# Patient Record
Sex: Male | Born: 1995 | Race: White | Hispanic: No | Marital: Single | Smoking: Current every day smoker
Health system: Southern US, Community
[De-identification: ages and names within clinical notes are randomized; demographics above are authoritative.]

## PROBLEM LIST (undated history)

## (undated) VITALS — BP 112/74 | HR 104 | Temp 98.2°F | Resp 20 | Ht 68.75 in | Wt 122.0 lb

## (undated) DIAGNOSIS — F4310 Post-traumatic stress disorder, unspecified: Secondary | ICD-10-CM

## (undated) DIAGNOSIS — F1994 Other psychoactive substance use, unspecified with psychoactive substance-induced mood disorder: Secondary | ICD-10-CM

## (undated) DIAGNOSIS — F319 Bipolar disorder, unspecified: Secondary | ICD-10-CM

## (undated) DIAGNOSIS — F1910 Other psychoactive substance abuse, uncomplicated: Secondary | ICD-10-CM

## (undated) DIAGNOSIS — Z9189 Other specified personal risk factors, not elsewhere classified: Secondary | ICD-10-CM

## (undated) HISTORY — PX: OTHER SURGICAL HISTORY: SHX169

---
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
## 2014-04-27 ENCOUNTER — Encounter (HOSPITAL_COMMUNITY): Payer: Self-pay | Admitting: Emergency Medicine

## 2014-04-27 ENCOUNTER — Emergency Department (HOSPITAL_COMMUNITY)
Admission: EM | Admit: 2014-04-27 | Discharge: 2014-04-27 | Disposition: A | Discharge status: Home / Self Care | Payer: BC Managed Care – PPO | Attending: Emergency Medicine | Admitting: Emergency Medicine

## 2014-04-27 DIAGNOSIS — Y9389 Activity, other specified: Secondary | ICD-10-CM | POA: Insufficient documentation

## 2014-04-27 DIAGNOSIS — Y929 Unspecified place or not applicable: Secondary | ICD-10-CM | POA: Insufficient documentation

## 2014-04-27 DIAGNOSIS — F17200 Nicotine dependence, unspecified, uncomplicated: Secondary | ICD-10-CM | POA: Insufficient documentation

## 2014-04-27 DIAGNOSIS — T40601A Poisoning by unspecified narcotics, accidental (unintentional), initial encounter: Secondary | ICD-10-CM

## 2014-04-27 DIAGNOSIS — T4271XA Poisoning by unspecified antiepileptic and sedative-hypnotic drugs, accidental (unintentional), initial encounter: Secondary | ICD-10-CM | POA: Insufficient documentation

## 2014-04-27 DIAGNOSIS — Z79899 Other long term (current) drug therapy: Secondary | ICD-10-CM | POA: Insufficient documentation

## 2014-04-27 LAB — CBC WITH DIFFERENTIAL/PLATELET
BASOS PCT: 0 % (ref 0–1)
Basophils Absolute: 0.1 10*3/uL (ref 0.0–0.1)
EOS ABS: 0.1 10*3/uL (ref 0.0–0.7)
Eosinophils Relative: 1 % (ref 0–5)
HCT: 44.9 % (ref 39.0–52.0)
HEMOGLOBIN: 16 g/dL (ref 13.0–17.0)
LYMPHS ABS: 1.4 10*3/uL (ref 0.7–4.0)
Lymphocytes Relative: 9 % — ABNORMAL LOW (ref 12–46)
MCH: 33.5 pg (ref 26.0–34.0)
MCHC: 35.6 g/dL (ref 30.0–36.0)
MCV: 94.1 fL (ref 78.0–100.0)
MONOS PCT: 5 % (ref 3–12)
Monocytes Absolute: 0.8 10*3/uL (ref 0.1–1.0)
NEUTROS ABS: 13.1 10*3/uL — AB (ref 1.7–7.7)
NEUTROS PCT: 85 % — AB (ref 43–77)
PLATELETS: 142 10*3/uL — AB (ref 150–400)
RBC: 4.77 MIL/uL (ref 4.22–5.81)
RDW: 11.6 % (ref 11.5–15.5)
WBC: 15.4 10*3/uL — ABNORMAL HIGH (ref 4.0–10.5)

## 2014-04-27 LAB — ETHANOL

## 2014-04-27 LAB — BASIC METABOLIC PANEL
Anion gap: 17 — ABNORMAL HIGH (ref 5–15)
BUN: 14 mg/dL (ref 6–23)
CHLORIDE: 102 meq/L (ref 96–112)
CO2: 19 mEq/L (ref 19–32)
Calcium: 9.1 mg/dL (ref 8.4–10.5)
Creatinine, Ser: 1.59 mg/dL — ABNORMAL HIGH (ref 0.50–1.35)
GFR, EST AFRICAN AMERICAN: 71 mL/min — AB (ref >90)
GFR, EST NON AFRICAN AMERICAN: 61 mL/min — AB (ref >90)
GLUCOSE: 192 mg/dL — AB (ref 70–99)
POTASSIUM: 4 meq/L (ref 3.7–5.3)
SODIUM: 138 meq/L (ref 137–147)

## 2014-04-27 MED ORDER — ONDANSETRON HCL 4 MG/2ML IJ SOLN
4.0000 mg | Freq: Once | INTRAMUSCULAR | Status: AC
  Administered 2014-04-27: 4 mg via INTRAVENOUS
  Filled 2014-04-27: qty 2

## 2014-04-27 MED ORDER — SODIUM CHLORIDE 0.9 % IV SOLN
INTRAVENOUS | Status: DC
  Administered 2014-04-27: 13:00:00 via INTRAVENOUS

## 2014-04-27 NOTE — ED Provider Notes (Signed)
CSN: [id number]     Arrival date & time 04/27/14  1242 History   First MD Initiated Contact with Patient 04/27/14 1253     Chief Complaint  Patient presents with  . Drug Overdose     (Consider location/radiation/quality/duration/timing/severity/associated sxs/prior Treatment) HPI  Sabino Niemanndam J Rhoads is a 18 y.o. male who is here by EMS, after treatment with Narcan for suspected narcotics overdose. The patient's friends placed him in a i-STAT, and injected his vein, with salt water, after he became unresponsive, following taking heroin. The patient is a reluctant historian. He does not want to tell me what happened today. He, states that he just wants to see his friend.  Level V caveat- poor historian  History reviewed. No pertinent past medical history. History reviewed. No pertinent past surgical history. History reviewed. No pertinent family history. History  Substance Use Topics  . Smoking status: Current Every Day Smoker -- 1.00 packs/day    Types: Cigarettes  . Smokeless tobacco: Never Used  . Alcohol Use: Yes     Comment: occasionally     Review of Systems  Unable to perform ROS     Allergies  Review of patient's allergies indicates no known allergies.  Home Medications   Prior to Admission medications   Medication Sig Start Date End Date Taking? Authorizing Provider  divalproex (DEPAKOTE) 500 MG DR tablet Take 500-1,000 mg by mouth 2 (two) times daily. 500mg  in the morning and 1000mg  at night    Historical Provider, MD  lamoTRIgine (LAMICTAL) 25 MG tablet Take 75 mg by mouth daily.    Historical Provider, MD   BP 128/92  Pulse 99  Temp(Src) 96.3 F (35.7 C) (Rectal)  Resp 23  SpO2 100% Physical Exam  Nursing note and vitals reviewed. Constitutional: He is oriented to person, place, and time. He appears well-developed and well-nourished.  HENT:  Head: Normocephalic and atraumatic.  Right Ear: External ear normal.  Left Ear: External ear normal.  Eyes:  Conjunctivae and EOM are normal. Pupils are equal, round, and reactive to light.  Neck: Normal range of motion and phonation normal. Neck supple.  Cardiovascular: Normal rate, regular rhythm, normal heart sounds and intact distal pulses.   Pulmonary/Chest: Effort normal and breath sounds normal. He exhibits no bony tenderness.  Abdominal: Soft. He exhibits no mass. There is no tenderness. There is no guarding.  Musculoskeletal: Normal range of motion.  Neurological: He is alert and oriented to person, place, and time. No cranial nerve deficit or sensory deficit. He exhibits normal muscle tone. Coordination normal.  Skin: Skin is warm, dry and intact.  Psychiatric: His behavior is normal. Judgment and thought content normal.  Tearful, appears sad    ED Course  Procedures (including critical care time)  Medications  0.9 %  sodium chloride infusion ( Intravenous New Bag/Given 04/27/14 1303)    Patient Vitals for the past 24 hrs:  BP Temp Temp src Pulse Resp SpO2  04/27/14 1500 128/92 mmHg - - - 23 -  04/27/14 1445 - - - 99 17 100 %  04/27/14 1430 127/98 mmHg - - 69 19 97 %  04/27/14 1403 125/97 mmHg - - 93 14 99 %  04/27/14 1251 144/88 mmHg 96.3 F (35.7 C) Rectal 69 16 99 %  04/27/14 1246 - - - 72 16 100 %  04/27/14 1244 144/88 mmHg - - - - -    3:37 PM Reevaluation with update and discussion. After initial assessment and treatment, an updated evaluation reveals  is sleeping, but arouses easily. He is alert and communicative. He denies pain, headache, trouble breathing, chest pain, nausea, or vomiting. He declines any additional assistance. Findings discussed with patient, questions answered. Jocelyne Reinertsen L   Labs Review Labs Reviewed  CBC WITH DIFFERENTIAL - Abnormal; Notable for the following:    WBC 15.4 (*)    Platelets 142 (*)    Neutrophils Relative % 85 (*)    Neutro Abs 13.1 (*)    Lymphocytes Relative 9 (*)    All other components within normal limits  BASIC METABOLIC  PANEL - Abnormal; Notable for the following:    Glucose, Bld 192 (*)    Creatinine, Ser 1.59 (*)    GFR calc non Af Amer 61 (*)    GFR calc Af Amer 71 (*)    Anion gap 17 (*)    All other components within normal limits  ETHANOL  URINALYSIS, ROUTINE W REFLEX MICROSCOPIC  URINE RAPID DRUG SCREEN (HOSP PERFORMED)    Imaging Review No results found.   EKG Interpretation   Date/Time:  Tuesday April 27 2014 12:45:33 EDT Ventricular Rate:  69 PR Interval:  154 QRS Duration: 81 QT Interval:  430 QTC Calculation: 461 R Axis:   76 Text Interpretation:  Sinus rhythm Atrial premature complex RSR' in V1 or  V2, probably normal variant Nonspecific T abnrm, anterolateral leads No  old tracing to compare Confirmed by [facility]  MD, Faustina Gebert [id number]) on  04/27/2014 1:53:17 PM      MDM   Final diagnoses:  Narcotic overdose, accidental or unintentional, initial encounter    Probable heroin overdose, without significant associated toxic or metabolic abnormality.  Nursing Notes Reviewed/ Care Coordinated Applicable Imaging Reviewed Interpretation of Laboratory Data incorporated into ED treatment  The patient appears reasonably screened and/or stabilized for discharge and I doubt any other medical condition or other [facility] requiring further screening, evaluation, or treatment in the ED at this time prior to discharge.  Plan: Home Medications- none; Home Treatments- Stop using Heroin; return here if the recommended treatment, does not improve the symptoms; Recommended follow up- PCP of choice prn    Flint Melter, MD 04/27/14 1542

## 2014-04-27 NOTE — ED Notes (Signed)
Sister contacted, on her way to pick patient up. MD Effie ShyWentz ok for patient to leave without urine collected. Patient was unable to give a sample. In and out cath unsuccessful.

## 2014-04-27 NOTE — ED Notes (Signed)
Per family friend- "he told me he took a bunch of Xanax. His dad shot himself recently and he (patient) found him. The funeral is today." Attempting to reach sister, unsuccessful. Continually re-orienting patient and reassuring him we are trying to contact his sister.

## 2014-04-27 NOTE — ED Notes (Signed)
Per EMS- friend's found patient unconscious and tried to put him in the shower. Unsure what patient overdosed on. Needle on scene. Unsure what this was used for. Friends and patient poor historians. Friends drew up unknown amount of table salt and inserted this is patient's veins. 1mg  Narcan given intranasally. Pulse bounding. RR under control after Narcan. 1mg  Narcan given IV. 18 G PIV in right FA.  A&Ox4. VS: BP 140/100 HR 100 RR 20.

## 2014-04-27 NOTE — ED Notes (Signed)
Attempted calling Spencer AddisonKatie (sister) at [phone]. No answer. Will attempt to call back promptly.  Pt aware we need urine sample.

## 2014-04-27 NOTE — ED Notes (Signed)
Bed: RESB Expected date:  Expected time:  Means of arrival:  Comments: EMS-OD 

## 2014-04-27 NOTE — ED Notes (Signed)
Pt aware we need urine sample. Will re-evaluate shortly.

## 2014-04-27 NOTE — Discharge Instructions (Signed)
Avoid using heroin or other intoxication substances.  Follow up with a medical doctor as needed for ongoing problems.   Accidental Overdose A drug overdose occurs when a chemical substance (drug or medication) is used in amounts large enough to overcome a person. This may result in severe illness or death. This is a type of poisoning. Accidental overdoses of medications or other substances come from a variety of reasons. When this happens accidentally, it is often because the person taking the substance does not know enough about what they have taken. Drugs which commonly cause overdose deaths are alcohol, psychotropic medications (medications which affect the mind), pain medications, illegal drugs (street drugs) such as cocaine and heroin, and multiple drugs taken at the same time. It may result from careless behavior (such as over-indulging at a party). Other causes of overdose may include multiple drug use, a lapse in memory, or drug use after a period of no drug use.  Sometimes overdosing occurs because a person cannot remember if they have taken their medication.  A common unintentional overdose in young children involves multi-vitamins containing iron. Iron is a part of the hemoglobin molecule in blood. It is used to transport oxygen to living cells. When taken in small amounts, iron allows the body to restock hemoglobin. In large amounts, it causes problems in the body. If this overdose is not treated, it can lead to death. Never take medicines that show signs of tampering or do not seem quite right. Never take medicines in the dark or in poor lighting. Read the label and check each dose of medicine before you take it. When adults are poisoned, it happens most often through carelessness or lack of information. Taking medicines in the dark or taking medicine prescribed for someone else to treat the same type of problem is a dangerous practice. SYMPTOMS  Symptoms of overdose depend on the medication  and amount taken. They can vary from over-activity with stimulant over-dosage, to sleepiness from depressants such as alcohol, narcotics and tranquilizers. Confusion, dizziness, nausea and vomiting may be present. If problems are severe enough coma and death may result. DIAGNOSIS  Diagnosis and management are generally straightforward if the drug is known. Otherwise it is more difficult. At times, certain symptoms and signs exhibited by the patient, or blood tests, can reveal the drug in question.  TREATMENT  In an emergency department, most patients can be treated with supportive measures. Antidotes may be available if there has been an overdose of opioids or benzodiazepines. A rapid improvement will often occur if this is the cause of overdose. At home or away from medical care:  There may be no immediate problems or warning signs in children.  Not everything works well in all cases of poisoning.  Take immediate action. Poisons may act quickly.  If you think someone has swallowed medicine or a household product, and the person is unconscious, having seizures (convulsions), or is not breathing, immediately call for an ambulance. IF a person is conscious and appears to be doing OK but has swallowed a poison:  Do not wait to see what effect the poison will have. Immediately call a poison control center (listed in the white pages of your telephone book under "Poison Control" or inside the front cover with other emergency numbers). Some poison control centers have TTY capability for the deaf. Check with your local center if you or someone in your family requires this service.  Keep the container so you can read the label on the  product for ingredients.  Describe what, when, and how much was taken and the age and condition of the person poisoned. Inform them if the person is vomiting, choking, drowsy, shows a change in color or temperature of skin, is conscious or unconscious, or is convulsing.  Do  not cause vomiting unless instructed by medical personnel. Do not induce vomiting or force liquids into a person who is convulsing, unconscious, or very drowsy. Stay calm and in control.   Activated charcoal also is sometimes used in certain types of poisoning and you may wish to add a supply to your emergency medicines. It is available without a prescription. Call a poison control center before using this medication. PREVENTION  Thousands of children die every year from unintentional poisoning. This may be from household chemicals, poisoning from carbon monoxide in a car, taking their parent's medications, or simply taking a few iron pills or vitamins with iron. Poisoning comes from unexpected sources.  Store medicines out of the sight and reach of children, preferably in a locked cabinet. Do not keep medications in a food cabinet. Always store your medicines in a secure place. Get rid of expired medications.  If you have children living with you or have them as occasional guests, you should have child-resistant caps on your medicine containers. Keep everything out of reach. Child proof your home.  If you are called to the telephone or to answer the door while you are taking a medicine, take the container with you or put the medicine out of the reach of small children.  Do not take your medication in front of children. Do not tell your child how good a medication is and how good it is for them. They may get the idea it is more of a treat.  If you are an adult and have accidentally taken an overdose, you need to consider how this happened and what can be done to prevent it from happening again. If this was from a street drug or alcohol, determine if there is a problem that needs addressing. If you are not sure a problems exists, it is easy to talk to a professional and ask them if they think you have a problem. It is better to handle this problem in this way before it happens again and has a much  worse consequence. Document Released: 12/01/2004 Document Revised: 12/10/2011 Document Reviewed: 05/09/2009 [facility] Patient Information 2015 [place]. This information is not intended to replace advice given to you by your health care provider. Make sure you discuss any questions you have with your health care provider.

## 2014-04-27 NOTE — ED Notes (Signed)
Fluids given, tolerated well per MD.

## 2014-05-18 ENCOUNTER — Emergency Department (HOSPITAL_BASED_OUTPATIENT_CLINIC_OR_DEPARTMENT_OTHER): Payer: BC Managed Care – PPO

## 2014-05-18 ENCOUNTER — Encounter (HOSPITAL_BASED_OUTPATIENT_CLINIC_OR_DEPARTMENT_OTHER): Payer: Self-pay | Admitting: Emergency Medicine

## 2014-05-18 ENCOUNTER — Inpatient Hospital Stay (HOSPITAL_BASED_OUTPATIENT_CLINIC_OR_DEPARTMENT_OTHER)
Admission: EM | Admit: 2014-05-18 | Discharge: 2014-05-18 | DRG: 918 | Discharge status: Against Medical Advice | Payer: BC Managed Care – PPO | Attending: Internal Medicine | Admitting: Internal Medicine

## 2014-05-18 DIAGNOSIS — T424X4A Poisoning by benzodiazepines, undetermined, initial encounter: Secondary | ICD-10-CM | POA: Diagnosis present

## 2014-05-18 DIAGNOSIS — F319 Bipolar disorder, unspecified: Secondary | ICD-10-CM | POA: Diagnosis present

## 2014-05-18 DIAGNOSIS — T40601S Poisoning by unspecified narcotics, accidental (unintentional), sequela: Secondary | ICD-10-CM

## 2014-05-18 DIAGNOSIS — T43591A Poisoning by other antipsychotics and neuroleptics, accidental (unintentional), initial encounter: Secondary | ICD-10-CM | POA: Diagnosis present

## 2014-05-18 DIAGNOSIS — T400X1A Poisoning by opium, accidental (unintentional), initial encounter: Principal | ICD-10-CM | POA: Diagnosis present

## 2014-05-18 DIAGNOSIS — F17200 Nicotine dependence, unspecified, uncomplicated: Secondary | ICD-10-CM | POA: Diagnosis present

## 2014-05-18 DIAGNOSIS — T6591XS Toxic effect of unspecified substance, accidental (unintentional), sequela: Secondary | ICD-10-CM

## 2014-05-18 DIAGNOSIS — T40601A Poisoning by unspecified narcotics, accidental (unintentional), initial encounter: Secondary | ICD-10-CM | POA: Diagnosis present

## 2014-05-18 DIAGNOSIS — F1310 Sedative, hypnotic or anxiolytic abuse, uncomplicated: Secondary | ICD-10-CM | POA: Diagnosis present

## 2014-05-18 DIAGNOSIS — F1210 Cannabis abuse, uncomplicated: Secondary | ICD-10-CM | POA: Diagnosis present

## 2014-05-18 DIAGNOSIS — F1910 Other psychoactive substance abuse, uncomplicated: Secondary | ICD-10-CM | POA: Diagnosis present

## 2014-05-18 DIAGNOSIS — Z79899 Other long term (current) drug therapy: Secondary | ICD-10-CM | POA: Diagnosis not present

## 2014-05-18 DIAGNOSIS — F1410 Cocaine abuse, uncomplicated: Secondary | ICD-10-CM | POA: Diagnosis present

## 2014-05-18 DIAGNOSIS — T50901S Poisoning by unspecified drugs, medicaments and biological substances, accidental (unintentional), sequela: Secondary | ICD-10-CM

## 2014-05-18 HISTORY — DX: Bipolar disorder, unspecified: F31.9

## 2014-05-18 LAB — CBC WITH DIFFERENTIAL/PLATELET
BASOS PCT: 1 % (ref 0–1)
Basophils Absolute: 0 10*3/uL (ref 0.0–0.1)
EOS PCT: 2 % (ref 0–5)
Eosinophils Absolute: 0.2 10*3/uL (ref 0.0–0.7)
HEMATOCRIT: 41.6 % (ref 39.0–52.0)
HEMOGLOBIN: 14.3 g/dL (ref 13.0–17.0)
Lymphocytes Relative: 43 % (ref 12–46)
Lymphs Abs: 2.8 10*3/uL (ref 0.7–4.0)
MCH: 32.7 pg (ref 26.0–34.0)
MCHC: 34.4 g/dL (ref 30.0–36.0)
MCV: 95.2 fL (ref 78.0–100.0)
MONO ABS: 0.6 10*3/uL (ref 0.1–1.0)
MONOS PCT: 9 % (ref 3–12)
NEUTROS ABS: 2.9 10*3/uL (ref 1.7–7.7)
Neutrophils Relative %: 45 % (ref 43–77)
Platelets: 136 10*3/uL — ABNORMAL LOW (ref 150–400)
RBC: 4.37 MIL/uL (ref 4.22–5.81)
RDW: 10.8 % — ABNORMAL LOW (ref 11.5–15.5)
WBC: 6.5 10*3/uL (ref 4.0–10.5)

## 2014-05-18 LAB — COMPREHENSIVE METABOLIC PANEL
ALBUMIN: 4.3 g/dL (ref 3.5–5.2)
ALK PHOS: 54 U/L (ref 39–117)
ALT: 13 U/L (ref 0–53)
AST: 20 U/L (ref 0–37)
Anion gap: 13 (ref 5–15)
BILIRUBIN TOTAL: 0.4 mg/dL (ref 0.3–1.2)
BUN: 20 mg/dL (ref 6–23)
CHLORIDE: 104 meq/L (ref 96–112)
CO2: 26 meq/L (ref 19–32)
CREATININE: 1.6 mg/dL — AB (ref 0.50–1.35)
Calcium: 9.5 mg/dL (ref 8.4–10.5)
GFR calc Af Amer: 70 mL/min — ABNORMAL LOW (ref >90)
GFR calc non Af Amer: 61 mL/min — ABNORMAL LOW (ref >90)
Glucose, Bld: 121 mg/dL — ABNORMAL HIGH (ref 70–99)
POTASSIUM: 3.4 meq/L — AB (ref 3.7–5.3)
SODIUM: 143 meq/L (ref 137–147)
Total Protein: 6.7 g/dL (ref 6.0–8.3)

## 2014-05-18 LAB — RAPID URINE DRUG SCREEN, HOSP PERFORMED
Amphetamines: NOT DETECTED
BARBITURATES: NOT DETECTED
Benzodiazepines: POSITIVE — AB
Cocaine: POSITIVE — AB
Opiates: POSITIVE — AB
TETRAHYDROCANNABINOL: POSITIVE — AB

## 2014-05-18 LAB — CBG MONITORING, ED
GLUCOSE-CAPILLARY: 107 mg/dL — AB (ref 70–99)
Glucose-Capillary: 98 mg/dL (ref 70–99)

## 2014-05-18 LAB — LIPASE, BLOOD: LIPASE: 152 U/L — AB (ref 11–59)

## 2014-05-18 LAB — URINALYSIS, ROUTINE W REFLEX MICROSCOPIC
BILIRUBIN URINE: NEGATIVE
Glucose, UA: NEGATIVE mg/dL
Hgb urine dipstick: NEGATIVE
KETONES UR: NEGATIVE mg/dL
LEUKOCYTES UA: NEGATIVE
NITRITE: NEGATIVE
Protein, ur: 30 mg/dL — AB
Specific Gravity, Urine: 1.021 (ref 1.005–1.030)
UROBILINOGEN UA: 1 mg/dL (ref 0.0–1.0)
pH: 6 (ref 5.0–8.0)

## 2014-05-18 LAB — ETHANOL: Alcohol, Ethyl (B): <11 mg/dL (ref 0–11)

## 2014-05-18 LAB — URINE MICROSCOPIC-ADD ON

## 2014-05-18 LAB — ACETAMINOPHEN LEVEL: Acetaminophen (Tylenol), Serum: <15 ug/mL (ref 10–30)

## 2014-05-18 LAB — MRSA PCR SCREENING: MRSA by PCR: NEGATIVE

## 2014-05-18 MED ORDER — SODIUM CHLORIDE 0.9 % IV BOLUS (SEPSIS)
250.0000 mL | Freq: Once | INTRAVENOUS | Status: AC
  Administered 2014-05-18: 250 mL via INTRAVENOUS

## 2014-05-18 MED ORDER — ONDANSETRON HCL 4 MG/2ML IJ SOLN
4.0000 mg | Freq: Once | INTRAMUSCULAR | Status: AC
  Administered 2014-05-18: 4 mg via INTRAVENOUS
  Filled 2014-05-18: qty 2

## 2014-05-18 MED ORDER — NALOXONE HCL 0.4 MG/ML IJ SOLN
INTRAMUSCULAR | Status: AC
  Filled 2014-05-18: qty 1

## 2014-05-18 MED ORDER — NALOXONE HCL 0.4 MG/ML IJ SOLN
INTRAMUSCULAR | Status: DC
Start: 2014-05-18 — End: 2014-05-18
  Filled 2014-05-18: qty 1

## 2014-05-18 MED ORDER — NALOXONE HCL 1 MG/ML IJ SOLN
2.0000 mg | Freq: Once | INTRAMUSCULAR | Status: AC
  Administered 2014-05-18: 2 mg via INTRAVENOUS
  Filled 2014-05-18: qty 2

## 2014-05-18 MED ORDER — NALOXONE HCL 1 MG/ML IJ SOLN
INTRAMUSCULAR | Status: AC
  Administered 2014-05-18: 2 mg
  Filled 2014-05-18: qty 4

## 2014-05-18 MED ORDER — SODIUM CHLORIDE 0.9 % IV SOLN: INTRAVENOUS | Status: DC

## 2014-05-18 MED ORDER — NALOXONE HCL 0.4 MG/ML IJ SOLN
0.4000 mg | Freq: Once | INTRAMUSCULAR | Status: AC
  Administered 2014-05-18: 0.4 mg via INTRAVENOUS

## 2014-05-18 MED ORDER — SODIUM CHLORIDE 0.9 % IV SOLN
INTRAVENOUS | Status: DC
  Administered 2014-05-18 (×2): via INTRAVENOUS

## 2014-05-18 NOTE — ED Notes (Signed)
PCXR performed

## 2014-05-18 NOTE — ED Notes (Signed)
Report called to Anna, RN.

## 2014-05-18 NOTE — ED Notes (Signed)
Opens eyes to voice, however does not follow commands. Reoriented to situation and place.

## 2014-05-18 NOTE — ED Provider Notes (Signed)
CSN: [id number]     Arrival date & time 05/18/14  1136 History   First MD Initiated Contact with Patient 05/18/14 1148     Chief Complaint  Patient presents with  . Drug Overdose     (Consider location/radiation/quality/duration/timing/severity/associated sxs/prior Treatment) The history is provided by a friend. The history is limited by the condition of the patient.   level V caveat applies to the history due to the patient's unresponsiveness.  18 year old male dropped off by friends to be unresponsive with shallow breathing. Friend stated that patient had taken 2 Ambien and 2 Percocet one hour prior to arrival. Friend left.  Past Medical History  Diagnosis Date  . Substance abuse   . Bipolar 1 disorder    Past Surgical History  Procedure Laterality Date  . Unknown     No family history on file. History  Substance Use Topics  . Smoking status: Current Every Day Smoker -- 1.00 packs/day    Types: Cigarettes  . Smokeless tobacco: Never Used  . Alcohol Use: Yes     Comment: occasionally     Review of Systems  Unable to perform ROS  level V caveat applies the review of systems patient unresponsive upon arrival    Allergies  Review of patient's allergies indicates no known allergies.  Home Medications   Prior to Admission medications   Medication Sig Start Date End Date Taking? Authorizing Provider  divalproex (DEPAKOTE) 500 MG DR tablet Take 500-1,000 mg by mouth 2 (two) times daily. 500mg  in the morning and 1000mg  at night    Historical Provider, MD  lamoTRIgine (LAMICTAL) 25 MG tablet Take 75 mg by mouth daily.    Historical Provider, MD   BP 114/78  Pulse 112  Resp 17  Wt 135 lb (61.236 kg)  SpO2 100% Physical Exam  Nursing note and vitals reviewed. Constitutional: He appears well-developed and well-nourished. He appears distressed.  Unresponsive shallow breathing  Eyes:  Pupils pinpoint  Neck: Neck supple.  Cardiovascular: Regular rhythm and normal heart  sounds.   No murmur heard. Tachycardic  Pulmonary/Chest: He has no wheezes. He has no rales.  Shallow breathing  Abdominal: Soft. Bowel sounds are normal. He exhibits no distension.  Musculoskeletal: Normal range of motion. He exhibits no edema.  Neurological:  Unresponsive  Skin: No rash noted. There is erythema.    ED Course  Procedures (including critical care time) Labs Review Labs Reviewed  URINALYSIS, ROUTINE W REFLEX MICROSCOPIC - Abnormal; Notable for the following:    Protein, ur 30 (*)    All other components within normal limits  URINE RAPID DRUG SCREEN (HOSP PERFORMED) - Abnormal; Notable for the following:    Opiates POSITIVE (*)    Cocaine POSITIVE (*)    Benzodiazepines POSITIVE (*)    Tetrahydrocannabinol POSITIVE (*)    All other components within normal limits  COMPREHENSIVE METABOLIC PANEL - Abnormal; Notable for the following:    Potassium 3.4 (*)    Glucose, Bld 121 (*)    Creatinine, Ser 1.60 (*)    GFR calc non Af Amer 61 (*)    GFR calc Af Amer 70 (*)    All other components within normal limits  LIPASE, BLOOD - Abnormal; Notable for the following:    Lipase 152 (*)    All other components within normal limits  CBC WITH DIFFERENTIAL - Abnormal; Notable for the following:    RDW 10.8 (*)    Platelets 136 (*)    All other components  within normal limits  CBG MONITORING, ED - Abnormal; Notable for the following:    Glucose-Capillary 107 (*)    All other components within normal limits  ETHANOL  ACETAMINOPHEN LEVEL  URINE MICROSCOPIC-ADD ON    Imaging Review Dg Chest Port 1 View  05/25/14   CLINICAL DATA:  Overdose of with Percocet and sacs now with altered mental status  EXAM: PORTABLE CHEST - 1 VIEW  COMPARISON:  None.  FINDINGS: The lungs are mildly hyperinflated and clear. The heart and mediastinal structures are normal. The pulmonary vascularity is not engorged. The bony thorax is unremarkable.  IMPRESSION: There is no acute cardiopulmonary  abnormality. Mild hyperinflation may be voluntary or reflect the patient's smoking history.   Electronically Signed   By: David  [place]   On: 05-25-2014 12:18     EKG Interpretation   Date/Time:  Tuesday May 25, 2014 12:14:03 EDT Ventricular Rate:  96 PR Interval:  148 QRS Duration: 96 QT Interval:  362 QTC Calculation: 457 R Axis:   73 Text Interpretation:  Normal sinus rhythm Possible Left atrial enlargement  Incomplete right bundle branch block Borderline ECG No significant change  since last tracing Confirmed by Quincy Prisco  MD, Jaeshaun Riva ([id number]) on 05/25/14  12:19:28 PM     CRITICAL CARE Performed by: Vanetta Mulders Total critical care time: 30 Critical care time was exclusive of separately billable procedures and treating other patients. Critical care was necessary to treat or prevent imminent or life-threatening deterioration. Critical care was time spent personally by me on the following activities: development of treatment plan with patient and/or surrogate as well as nursing, discussions with consultants, evaluation of patient's response to treatment, examination of patient, obtaining history from patient or surrogate, ordering and performing treatments and interventions, ordering and review of laboratory studies, ordering and review of radiographic studies, pulse oximetry and re-evaluation of patient's condition.  MDM   Final diagnoses:  Opiate overdose, accidental or unintentional, sequela    The patient dropped off by friends unresponsive shallow breathing pinpoint pupils. Friend stated he took 2 Ambien and 2 Percocet one hour prior to arrival. Review of patient's records shows admitted [place] [facility] on July 28 for similar presentation. Patient recovered in the ED and was discharged home. Patient has a past medical history significant for substance abuse and bipolar disorder. Patient required Narcan received 2 mg at dinner rales was 100%  nonrebreather she sats 1 from the 70s up to 100%. 2 hours after the initial dose of Narcan the patient became drowsy and less responsive, and was given an additional 0.4 mg of Narcan without any significant improvement. Another 2 mg of Narcan given. Patient will require step down admission transfer arranged to [facility] the hospitalist service. Patient's urine drug screen positive for opiates benzos and THC. Patient when he was awake with not admit to taking anything says he doesn't remember anything. Patient was confronted about the admission in July and at what was found in the urine drug screen and told that he had arrived in very serious life-threatening condition almost died.    Vanetta Mulders, MD May 25, 2014 [phone]

## 2014-05-18 NOTE — H&P (Signed)
[facility] History and Physical  Spencer Rogers OYD:[id number] DOB: 11-15-1995 DOA: 05/18/2014   PCP: No primary provider Spencer Rogers file.    Chief Complaint: unresponsive   HPI: Spencer Rogers Spencer Rogers is a 18 y.o. male dropped off by his friends unresponsive at the ER today. UDS revealed narcotics, benzos, marijuana and cocaine. He was given 3 doses to Narcan and then [facility] [place] [facility]. He is awake and alert now and tells me he only recalls using Xanax. He is feeling well. No nausea, vomiting or diarrhea. He has not been using drugs on a daily basis. He last used Heroin abut 2 wks ago. He does not want to stay in the hospital and will be leaving AMA.    General: The patient denies anorexia, fever, weight loss Cardiac: Denies chest pain, syncope, palpitations, pedal edema  Respiratory: Denies cough, shortness of breath, wheezing GI: Denies severe indigestion/heartburn, abdominal pain, nausea, vomiting, diarrhea and constipation GU: Denies hematuria, incontinence, dysuria  Musculoskeletal: Denies arthritis  Skin: Denies suspicious skin lesions Neurologic: Denies focal weakness or numbness, change in vision  Past Medical History  Diagnosis Date  . Substance abuse   . Bipolar 1 disorder     Past Surgical History  Procedure Laterality Date  . Unknown      Social History: smokes about 1/2 ppd. Does not drink Alcohol Living with sister for past few days. Unable to afford living on his own. Works 2 jobs.    No Known Allergies  No family history on file.    Prior to Admission medications   Medication Sig Start Date End Date Taking? Authorizing Provider  divalproex (DEPAKOTE) 500 MG DR tablet Take 500-1,000 mg by mouth 2 (two) times daily. 500mg  in the morning and 1000mg  at night    Historical Provider, MD  lamoTRIgine (LAMICTAL) 25 MG tablet Take 75 mg by mouth daily.    Historical Provider, MD     Physical Exam: Filed Vitals:   05/18/14 1345 05/18/14  1440 05/18/14 1449 05/18/14 1617  BP: 114/78 107/69 114/74   Pulse: 112 117 111   Temp:   97.7 F (36.5 C) 98 F (36.7 C)  TempSrc:   Oral Oral  Resp: 17 12 16    Weight:      SpO2: 100% 100% 100%      General: sleepy HEENT: Normocephalic and Atraumatic, Mucous membranes pink                PERRLA; EOM intact; No scleral icterus,                 Nares: Patent, Oropharynx: Clear, Fair Dentition                 Neck: FROM, no cervical lymphadenopathy, thyromegaly, carotid bruit or JVD;  Breasts: deferred CHEST WALL: No tenderness  CHEST: Normal respiration, clear to auscultation bilaterally  HEART: Regular rate and rhythm; no murmurs rubs or gallops  BACK: No kyphosis or scoliosis; no CVA tenderness  ABDOMEN: Positive Bowel Sounds, soft, non-tender; no masses, no organomegaly Rectal Exam: deferred EXTREMITIES: No cyanosis, clubbing, or edema Genitalia: not examined  SKIN:  no rash or ulceration  CNS: Alert and Oriented x 4, Nonfocal exam, CN 2-12 intact  Labs on Admission:  Basic Metabolic Panel:  Recent Labs Lab 05/18/14 1140  NA 143  K 3.4*  CL 104  CO2 26  GLUCOSE 121*  BUN 20  CREATININE 1.60*  CALCIUM 9.5   Liver Function Tests:  Recent Labs Lab 05/18/14 1140  AST 20  ALT 13  ALKPHOS 54  BILITOT 0.4  PROT 6.7  ALBUMIN 4.3    Recent Labs Lab 05/18/14 1140  LIPASE 152*   No results found for this basename: AMMONIA,  in the last 168 hours CBC:  Recent Labs Lab 05/18/14 1140  WBC 6.5  NEUTROABS 2.9  HGB 14.3  HCT 41.6  MCV 95.2  PLT 136*   Cardiac Enzymes: No results found for this basename: CKTOTAL, CKMB, CKMBINDEX, TROPONINI,  in the last 168 hours  BNP (last 3 results) No results found for this basename: PROBNP,  in the last 8760 hours CBG:  Recent Labs Lab 05/18/14 1144 05/18/14 1441  GLUCAP 107* 98    Radiological Exams on Admission: Dg Chest Port 1 View  05/18/2014   CLINICAL DATA:  Overdose of with Percocet and sacs now  with altered mental status  EXAM: PORTABLE CHEST - 1 VIEW  COMPARISON:  None.  FINDINGS: The lungs are mildly hyperinflated and clear. The heart and mediastinal structures are normal. The pulmonary vascularity is not engorged. The bony thorax is unremarkable.  IMPRESSION: There is no acute cardiopulmonary abnormality. Mild hyperinflation may be voluntary or reflect the patient's smoking history.   Electronically Signed   By: David  [place]   On: 05/18/2014 12:18    EKG: Independently reviewed. NSR 96 bpm with incomplete right bundle branch block  Assessment/Plan Active Problems:   Opiate, Benzo, Cannabis and Cocaine abuse.  He is leaving AMA despite my advice to stay over night. His sister is present and will be driving him to her home where she will monitor him overnight.   Time spent: 45 min  Margurite Duffy, MD [facility]  If 7PM-7AM, please contact night-coverage [email or web address] 05/18/2014, 5:11 PM

## 2014-05-18 NOTE — Discharge Summary (Signed)
Physician Discharge Summary  Spencer Rogers ZOX:[id number] DOB: [birth year] DOA: 05/18/2014  PCP: No primary provider on file.  Admit date: 05/18/2014 Discharge date: 05/18/2014   Leaving AMA  Discharge Diagnoses:  Active Problems:   Opiate, benzodiazepine, Cocaine and Cannabis overdose   Discharge Condition: stable  Diet recommendation: heart healthy  Filed Weights   05/18/14 1140  Weight: 61.236 kg (135 lb)    History of present illness:  Spencer Rogers is a 18 y.o. male dropped off by his friends unresponsive at the ER today. UDS revealed narcotics, benzos, marijuana and cocaine. He was given 3 doses to Narcan and then [facility] [place] [facility]. He is awake and alert now and tells me he only recalls using Xanax. He is feeling well. No nausea, vomiting or diarrhea. He has not been using drugs on a daily basis. He last used Heroin abut 2 wks ago. He does not want to stay in the hospital and will be leaving AMA.   Discharge Exam: Filed Vitals:   05/18/14 1345 05/18/14 1440 05/18/14 1449 05/18/14 1617  BP: 114/78 107/69 114/74   Pulse: 112 117 111   Temp:   97.7 F (36.5 C) 98 F (36.7 C)  TempSrc:   Oral Oral  Resp: 17 12 16    Weight:      SpO2: 100% 100% 100%     General: AAA x 3 Cardiovascular: RRR, no murmurs Respiratory: CTA b/L  Discharge Instructions You were cared for by a hospitalist during your hospital stay. If you have any questions about your discharge medications or the care you received while you were in the hospital after you are discharged, you can call the unit and asked to speak with the hospitalist on call if the hospitalist that took care of you is not available. Once you are discharged, your primary care physician will handle any further medical issues. Please note that NO REFILLS for any discharge medications will be authorized once you are discharged, as it is imperative that you return to your primary care physician (or establish  a relationship with a primary care physician if you do not have one) for your aftercare needs so that they can reassess your need for medications and monitor your lab values.     Medication List    ASK your doctor about these medications       divalproex 500 MG DR tablet  Commonly known as:  DEPAKOTE  Take 500-1,000 mg by mouth 2 (two) times daily. 500mg  in the morning and 1000mg  at night     lamoTRIgine 25 MG tablet  Commonly known as:  LAMICTAL  Take 75 mg by mouth daily.       No Known Allergies    The results of significant diagnostics from this hospitalization (including imaging, microbiology, ancillary and laboratory) are listed below for reference.    Significant Diagnostic Studies: Dg Chest Port 1 View  05/18/2014   CLINICAL DATA:  Overdose of with Percocet and sacs now with altered mental status  EXAM: PORTABLE CHEST - 1 VIEW  COMPARISON:  None.  FINDINGS: The lungs are mildly hyperinflated and clear. The heart and mediastinal structures are normal. The pulmonary vascularity is not engorged. The bony thorax is unremarkable.  IMPRESSION: There is no acute cardiopulmonary abnormality. Mild hyperinflation may be voluntary or reflect the patient's smoking history.   Electronically Signed   By: David  [place]   On: 05/18/2014 12:18    Microbiology: No results found for  this or any previous visit (from the past 240 hour(s)).   Labs: Basic Metabolic Panel:  Recent Labs Lab 05/18/14 1140  NA 143  K 3.4*  CL 104  CO2 26  GLUCOSE 121*  BUN 20  CREATININE 1.60*  CALCIUM 9.5   Liver Function Tests:  Recent Labs Lab 05/18/14 1140  AST 20  ALT 13  ALKPHOS 54  BILITOT 0.4  PROT 6.7  ALBUMIN 4.3    Recent Labs Lab 05/18/14 1140  LIPASE 152*   No results found for this basename: AMMONIA,  in the last 168 hours CBC:  Recent Labs Lab 05/18/14 1140  WBC 6.5  NEUTROABS 2.9  HGB 14.3  HCT 41.6  MCV 95.2  PLT 136*   Cardiac Enzymes: No results found for  this basename: CKTOTAL, CKMB, CKMBINDEX, TROPONINI,  in the last 168 hours BNP: BNP (last 3 results) No results found for this basename: PROBNP,  in the last 8760 hours CBG:  Recent Labs Lab 05/18/14 1144 05/18/14 1441  GLUCAP 107* 98       SignedCalvert Cantor:  Naoma Boxell, MD [facility] 05/18/2014, 5:40 PM

## 2014-05-18 NOTE — Progress Notes (Signed)
Pt does not want to stay in hospital and already signed AMA, family at bedside and explained the pt the benefit of staying at least overnight to be monitored but he still refused.

## 2014-05-18 NOTE — ED Notes (Signed)
Arrived via POV with friends.  Unresponsive. Friends stated pt took "2 Ambien 10mg  and 2 Percocet" 1 hour PTA.
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
## 2014-05-18 NOTE — ED Notes (Signed)
MD at bedside. 

## 2014-05-18 NOTE — ED Notes (Signed)
Neta Mendshristina Scallion, emergency contact informed of plan of care and given room number.  Contact reports pt was verbalizing suicidal ideation last night, however he (patient) denies SI.  Tells me he was recently in AA and attempting to get placed in [facility] for rehab. Transport team at bedside and transferred to stretcher.  Pt transported to [facility] stable up d/c from this ED.

## 2014-05-18 NOTE — Plan of Care (Signed)
Called by care Link for patient Spencer Rogers, [facility] presenting with overdose  Briefly 18 year old male with history of polysubstance abuse, bipolar disorder, [facility] his friends and dropped off there unresponsive, pinpoint pupils. Urine drug screen showed benzodiazepines, opiates, cocaine and marijuana. Patient was given Narcan x2, patient became alert and mentioned taking 'pain medications'although unclear at this time what exactly he took. Similar presentation in July 2015 to [facility] ED with narcotic overdose, at that time he had injected himself with heroin.  BP 114/78  Pulse 112  Resp 17  Wt 61.236 kg (135 lb)  SpO2 100%  Patient accepted to step down unit, team 10.    RAI,RIPUDEEP M.D. [facility] 05/18/2014, 1:58 PM  Pager: [phone]

## 2014-05-18 NOTE — ED Notes (Signed)
Attempting to call pt sister, no answer, pt himself left a voicemail.

## 2014-05-18 NOTE — Progress Notes (Signed)
Pt got here in rm 2c14 [facility], confused, he is claiming that he had over 200 dollars in his wallet, but we only got 35 dollars and some coins in his black wallet witnessed by Marcelle SmilingNatasha RN,he also have 2 pair of shorts and 1 black shoe and he also missing his other pair of big round black (gauge)earing,already called security,he wants to go home and he is crying and requested a chaplain, MD was already paged, sitter at bedside.
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
## 2014-05-18 NOTE — ED Notes (Signed)
Talking and more alert.

## 2014-05-18 NOTE — ED Notes (Signed)
More drowsy.  VSS.  Opens eyes with sternal rub the back to sleep.  EDP informed.
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
## 2014-05-18 NOTE — ED Notes (Signed)
I &O cath inserted per protocol by Ninetta LightsAmy Burns, RN.  Pt tolerated well, clear yellow urine return noted and urine collected.

## 2014-05-18 NOTE — ED Notes (Signed)
MD at bedside speaking with pt.  Pt demanding we find his truck as well as his belongings and yelling at EDP.  Message left on sister's voicemail and attempting to contact emergency contact per pt request.

## 2014-05-22 ENCOUNTER — Inpatient Hospital Stay (HOSPITAL_COMMUNITY)
Admission: AD | Admit: 2014-05-22 | Discharge: 2014-05-25 | DRG: 897 | Disposition: A | Discharge status: Home / Self Care | Payer: BC Managed Care – PPO | Attending: Psychiatry | Admitting: Psychiatry

## 2014-05-22 ENCOUNTER — Encounter (HOSPITAL_COMMUNITY): Payer: Self-pay

## 2014-05-22 ENCOUNTER — Encounter (HOSPITAL_COMMUNITY): Payer: Self-pay | Admitting: Emergency Medicine

## 2014-05-22 ENCOUNTER — Emergency Department (HOSPITAL_COMMUNITY)
Admission: EM | Admit: 2014-05-22 | Discharge: 2014-05-22 | Disposition: A | Discharge status: Other Acute Inpatient Hospital | Payer: BC Managed Care – PPO | Attending: Emergency Medicine | Admitting: Emergency Medicine

## 2014-05-22 DIAGNOSIS — Z634 Disappearance and death of family member: Secondary | ICD-10-CM

## 2014-05-22 DIAGNOSIS — F1410 Cocaine abuse, uncomplicated: Secondary | ICD-10-CM | POA: Diagnosis present

## 2014-05-22 DIAGNOSIS — Z79899 Other long term (current) drug therapy: Secondary | ICD-10-CM | POA: Insufficient documentation

## 2014-05-22 DIAGNOSIS — R45851 Suicidal ideations: Secondary | ICD-10-CM

## 2014-05-22 DIAGNOSIS — IMO0001 Reserved for inherently not codable concepts without codable children: Secondary | ICD-10-CM | POA: Diagnosis present

## 2014-05-22 DIAGNOSIS — T40601A Poisoning by unspecified narcotics, accidental (unintentional), initial encounter: Secondary | ICD-10-CM | POA: Diagnosis present

## 2014-05-22 DIAGNOSIS — F1120 Opioid dependence, uncomplicated: Secondary | ICD-10-CM | POA: Diagnosis present

## 2014-05-22 DIAGNOSIS — F17200 Nicotine dependence, unspecified, uncomplicated: Secondary | ICD-10-CM | POA: Diagnosis present

## 2014-05-22 DIAGNOSIS — F319 Bipolar disorder, unspecified: Secondary | ICD-10-CM | POA: Insufficient documentation

## 2014-05-22 DIAGNOSIS — F1994 Other psychoactive substance use, unspecified with psychoactive substance-induced mood disorder: Secondary | ICD-10-CM | POA: Diagnosis present

## 2014-05-22 DIAGNOSIS — F4310 Post-traumatic stress disorder, unspecified: Secondary | ICD-10-CM | POA: Diagnosis present

## 2014-05-22 DIAGNOSIS — Z008 Encounter for other general examination: Secondary | ICD-10-CM | POA: Diagnosis present

## 2014-05-22 DIAGNOSIS — F1310 Sedative, hypnotic or anxiolytic abuse, uncomplicated: Secondary | ICD-10-CM | POA: Diagnosis present

## 2014-05-22 DIAGNOSIS — F411 Generalized anxiety disorder: Secondary | ICD-10-CM | POA: Diagnosis present

## 2014-05-22 DIAGNOSIS — F1210 Cannabis abuse, uncomplicated: Secondary | ICD-10-CM | POA: Diagnosis present

## 2014-05-22 DIAGNOSIS — F909 Attention-deficit hyperactivity disorder, unspecified type: Secondary | ICD-10-CM | POA: Diagnosis present

## 2014-05-22 DIAGNOSIS — F1110 Opioid abuse, uncomplicated: Secondary | ICD-10-CM

## 2014-05-22 DIAGNOSIS — G4700 Insomnia, unspecified: Secondary | ICD-10-CM | POA: Diagnosis present

## 2014-05-22 LAB — COMPREHENSIVE METABOLIC PANEL
ALT: 15 U/L (ref 0–53)
AST: 26 U/L (ref 0–37)
Albumin: 4 g/dL (ref 3.5–5.2)
Alkaline Phosphatase: 53 U/L (ref 39–117)
Anion gap: 14 (ref 5–15)
BUN: 13 mg/dL (ref 6–23)
CO2: 22 mEq/L (ref 19–32)
Calcium: 9.6 mg/dL (ref 8.4–10.5)
Chloride: 102 mEq/L (ref 96–112)
Creatinine, Ser: 1.2 mg/dL (ref 0.50–1.35)
GFR calc Af Amer: >90 mL/min (ref >90)
GFR calc non Af Amer: 86 mL/min — ABNORMAL LOW (ref >90)
Glucose, Bld: 122 mg/dL — ABNORMAL HIGH (ref 70–99)
Potassium: 4 mEq/L (ref 3.7–5.3)
Sodium: 138 mEq/L (ref 137–147)
Total Bilirubin: 0.4 mg/dL (ref 0.3–1.2)
Total Protein: 6.7 g/dL (ref 6.0–8.3)

## 2014-05-22 LAB — ACETAMINOPHEN LEVEL: Acetaminophen (Tylenol), Serum: <15 ug/mL (ref 10–30)

## 2014-05-22 LAB — CBC
HCT: 40.4 % (ref 39.0–52.0)
Hemoglobin: 13.7 g/dL (ref 13.0–17.0)
MCH: 31.3 pg (ref 26.0–34.0)
MCHC: 33.9 g/dL (ref 30.0–36.0)
MCV: 92.2 fL (ref 78.0–100.0)
PLATELETS: 129 10*3/uL — AB (ref 150–400)
RBC: 4.38 MIL/uL (ref 4.22–5.81)
RDW: 11.1 % — ABNORMAL LOW (ref 11.5–15.5)
WBC: 3.7 10*3/uL — AB (ref 4.0–10.5)

## 2014-05-22 LAB — RAPID URINE DRUG SCREEN, HOSP PERFORMED
Amphetamines: POSITIVE — AB
Barbiturates: NOT DETECTED
Benzodiazepines: POSITIVE — AB
Cocaine: NOT DETECTED
Opiates: POSITIVE — AB
Tetrahydrocannabinol: POSITIVE — AB

## 2014-05-22 LAB — SALICYLATE LEVEL: Salicylate Lvl: <2 mg/dL — ABNORMAL LOW (ref 2.8–20.0)

## 2014-05-22 LAB — ETHANOL: Alcohol, Ethyl (B): <11 mg/dL (ref 0–11)

## 2014-05-22 MED ORDER — LOPERAMIDE HCL 2 MG PO CAPS: 2.0000 mg | ORAL_CAPSULE | ORAL | Status: DC | PRN

## 2014-05-22 MED ORDER — CLONIDINE HCL 0.1 MG PO TABS
0.1000 mg | ORAL_TABLET | Freq: Four times a day (QID) | ORAL | Status: AC
  Administered 2014-05-22 – 2014-05-25 (×7): 0.1 mg via ORAL
  Filled 2014-05-22 (×12): qty 1

## 2014-05-22 MED ORDER — NICOTINE POLACRILEX 2 MG MT GUM
2.0000 mg | CHEWING_GUM | OROMUCOSAL | Status: DC | PRN
  Filled 2014-05-22: qty 1

## 2014-05-22 MED ORDER — ONDANSETRON 4 MG PO TBDP: 4.0000 mg | ORAL_TABLET | Freq: Four times a day (QID) | ORAL | Status: DC | PRN

## 2014-05-22 MED ORDER — ONDANSETRON 4 MG PO TBDP
4.0000 mg | ORAL_TABLET | Freq: Four times a day (QID) | ORAL | Status: DC | PRN
  Administered 2014-05-22: 4 mg via ORAL
  Filled 2014-05-22: qty 1

## 2014-05-22 MED ORDER — CLONIDINE HCL 0.1 MG PO TABS
0.1000 mg | ORAL_TABLET | ORAL | Status: DC
Start: 2014-05-25 — End: 2014-05-25
  Filled 2014-05-22 (×4): qty 1

## 2014-05-22 MED ORDER — CHLORDIAZEPOXIDE HCL 25 MG PO CAPS: 25.0000 mg | ORAL_CAPSULE | ORAL | Status: DC

## 2014-05-22 MED ORDER — MAGNESIUM HYDROXIDE 400 MG/5ML PO SUSP
30.0000 mL | Freq: Every day | ORAL | Status: DC | PRN
  Administered 2014-05-24: 30 mL via ORAL

## 2014-05-22 MED ORDER — HYDROXYZINE HCL 25 MG PO TABS
25.0000 mg | ORAL_TABLET | Freq: Four times a day (QID) | ORAL | Status: DC | PRN
  Administered 2014-05-22 – 2014-05-23 (×3): 25 mg via ORAL
  Filled 2014-05-22 (×3): qty 1

## 2014-05-22 MED ORDER — DICYCLOMINE HCL 20 MG PO TABS: 20.0000 mg | ORAL_TABLET | Freq: Four times a day (QID) | ORAL | Status: DC | PRN

## 2014-05-22 MED ORDER — NAPROXEN 500 MG PO TABS: 500.0000 mg | ORAL_TABLET | Freq: Two times a day (BID) | ORAL | Status: DC | PRN

## 2014-05-22 MED ORDER — CHLORDIAZEPOXIDE HCL 25 MG PO CAPS: 25.0000 mg | ORAL_CAPSULE | Freq: Every day | ORAL | Status: DC

## 2014-05-22 MED ORDER — TRAZODONE HCL 50 MG PO TABS
50.0000 mg | ORAL_TABLET | Freq: Every evening | ORAL | Status: DC | PRN
  Administered 2014-05-23 – 2014-05-24 (×2): 50 mg via ORAL
  Filled 2014-05-22 (×2): qty 1
  Filled 2014-05-22: qty 4

## 2014-05-22 MED ORDER — CHLORDIAZEPOXIDE HCL 25 MG PO CAPS
25.0000 mg | ORAL_CAPSULE | Freq: Four times a day (QID) | ORAL | Status: DC
  Administered 2014-05-22: 25 mg via ORAL
  Filled 2014-05-22: qty 1

## 2014-05-22 MED ORDER — ALUM & MAG HYDROXIDE-SIMETH 200-200-20 MG/5ML PO SUSP: 30.0000 mL | ORAL | Status: DC | PRN

## 2014-05-22 MED ORDER — METHOCARBAMOL 500 MG PO TABS
500.0000 mg | ORAL_TABLET | Freq: Three times a day (TID) | ORAL | Status: DC | PRN
  Administered 2014-05-22 – 2014-05-23 (×3): 500 mg via ORAL
  Filled 2014-05-22 (×3): qty 1

## 2014-05-22 MED ORDER — CLONIDINE HCL 0.1 MG PO TABS: 0.1000 mg | ORAL_TABLET | Freq: Every day | ORAL | Status: DC

## 2014-05-22 MED ORDER — HYDROXYZINE HCL 25 MG PO TABS: 25.0000 mg | ORAL_TABLET | Freq: Four times a day (QID) | ORAL | Status: DC | PRN

## 2014-05-22 MED ORDER — CLONIDINE HCL 0.1 MG PO TABS
0.1000 mg | ORAL_TABLET | Freq: Four times a day (QID) | ORAL | Status: DC
Start: 2014-05-22 — End: 2014-05-22
  Administered 2014-05-22: 0.1 mg via ORAL
  Filled 2014-05-22: qty 1

## 2014-05-22 MED ORDER — ADULT MULTIVITAMIN W/MINERALS CH
1.0000 | ORAL_TABLET | Freq: Every day | ORAL | Status: DC
  Administered 2014-05-22: 1 via ORAL
  Filled 2014-05-22: qty 1

## 2014-05-22 MED ORDER — THIAMINE HCL 100 MG/ML IJ SOLN: 100.0000 mg | Freq: Once | INTRAMUSCULAR | Status: DC

## 2014-05-22 MED ORDER — CLONIDINE HCL 0.1 MG PO TABS: 0.1000 mg | ORAL_TABLET | ORAL | Status: DC

## 2014-05-22 MED ORDER — METHOCARBAMOL 500 MG PO TABS: 500.0000 mg | ORAL_TABLET | Freq: Three times a day (TID) | ORAL | Status: DC | PRN

## 2014-05-22 MED ORDER — CHLORDIAZEPOXIDE HCL 25 MG PO CAPS: 25.0000 mg | ORAL_CAPSULE | Freq: Four times a day (QID) | ORAL | Status: DC | PRN

## 2014-05-22 MED ORDER — VITAMIN B-1 100 MG PO TABS: 100.0000 mg | ORAL_TABLET | Freq: Every day | ORAL | Status: DC

## 2014-05-22 MED ORDER — DICYCLOMINE HCL 20 MG PO TABS
20.0000 mg | ORAL_TABLET | Freq: Four times a day (QID) | ORAL | Status: DC | PRN
  Administered 2014-05-22: 20 mg via ORAL
  Filled 2014-05-22: qty 1

## 2014-05-22 MED ORDER — NAPROXEN 500 MG PO TABS
500.0000 mg | ORAL_TABLET | Freq: Two times a day (BID) | ORAL | Status: DC | PRN
  Administered 2014-05-22 – 2014-05-24 (×4): 500 mg via ORAL
  Filled 2014-05-22 (×4): qty 1

## 2014-05-22 MED ORDER — CHLORDIAZEPOXIDE HCL 25 MG PO CAPS: 25.0000 mg | ORAL_CAPSULE | Freq: Three times a day (TID) | ORAL | Status: DC

## 2014-05-22 MED ORDER — ACETAMINOPHEN 325 MG PO TABS
650.0000 mg | ORAL_TABLET | Freq: Four times a day (QID) | ORAL | Status: DC | PRN
Start: 2014-05-22 — End: 2014-05-25

## 2014-05-22 NOTE — Tx Team (Signed)
Initial Interdisciplinary Treatment Plan   PATIENT STRESSORS: Financial difficulties Medication change or noncompliance Occupational concerns Substance abuse   PROBLEM LIST: Problem List/Patient Goals Date to be addressed Date deferred Reason deferred Estimated date of resolution  SA 05/22/14     Non-compliance with medications 05/22/14     Financial issues 05/22/14     Housing issues 05/22/14                                    DISCHARGE CRITERIA:  Improved stabilization in mood, thinking, and/or behavior Motivation to continue treatment in Rogers less acute level of care Withdrawal symptoms are absent or subacute and managed without 24-hour nursing intervention  PRELIMINARY DISCHARGE PLAN: Attend aftercare/continuing care group Placement in alternative living arrangements  PATIENT/FAMIILY INVOLVEMENT: This treatment plan has been presented to and reviewed with the patient, Spencer Rogers.  The patient and family have been given the opportunity to ask questions and make suggestions.  Spencer Rogers, Spencer Rogers 05/22/2014, 8:28 PM

## 2014-05-22 NOTE — ED Notes (Signed)
Patient [facility] Bed [facility]1 to Dr. Rosezella Rumpfobbos. EDP notified by Clinical research associatewriter.   [name] MSW, LCSW [facility]rapeutic Triage Services-Triage Specialist   Phone: [phone] Fax: [phone]

## 2014-05-22 NOTE — BH Assessment (Signed)
Tele Assessment Note   Spencer Rogers is an 18 y.o. male that presented as a walk-in to [facility] via family friends.  Pt presenting with SI and requesting detox from heroin.  Pt reported he began using heroin after he walked in on his father that committed suicide by shooting himself in the head.  Pt is the one that found him in his home.  Pt stated he has been sharing $70 between 3 people of IV heroin daily, last use was 30 cc (used 7 x that day) per pt.  Pt reported he has experimented with alcohol and drugs in the past, but not with heroin.  Pt stated, "I am so scared."  Pt reported current withdrawal sx include chills, feeling sweaty, moist skin, nausea, and tremor.  Pt denies the use of other drugs or alcohol.  However, notes from ED in [facility] show 2 overdoses over the past 2 months, and pt reported using several substances at that time.  Last visit to ED for overdose was  05/01/14 and prior visit 04/27/14.  Pt endorses paranoia, being scared to go to sleep because he is afraid he will see his dead father or mother (his mother died at age 12), keeps reliving and playing th scene in his head of the aftermath of his father's suicide, insomnia, and anxiety (including panic attacks).  Pt stated the only treatment he has had in the past was going to a wilderness camp for SA at age 43 and also to deal with bereavement and then participation [facility] for 3 years.  Pt was living with his sister, but she kicked him out by report for drug use.  He is working PT at [employer], but report current stressors are losing his apartment, his father's death, limited family support, and an upcoming court date on 05/31/14 for stealing a video game from [place].  Pt reports SI with recent gesture 2 nights ago (held knife in hand to harm self) and continued SI.  He denies HI or psychosis.  Pt was irritable, oriented x 4, had good eye contact, was visibly shaky, had logical/coherent thought processes, and  pressured speech.  Consulted with Dr. Tawni Carnes @ 1430 who accepted pt to [facility].  Pt sent to [facility] for med clearance via [place].  This clinician notified [place] and ED charge nurse at [facility].  Pt sent to [facility] for med clearance and will return once his bed is available.  Axis I: 304.00 Opioid Use Disorder, Severe, 309.81 Posttraumatic Stress Diosrder Axis II: Deferred Axis III:  Past Medical History  Diagnosis Date  . Substance abuse   . Bipolar 1 disorder    Axis IV: economic problems, housing problems, other psychosocial or environmental problems, problems related to legal system/crime and problems with primary support group Axis V: 21-30 behavior considerably influenced by delusions or hallucinations OR serious impairment in judgment, communication OR inability to function in almost all areas  Past Medical History:  Past Medical History  Diagnosis Date  . Substance abuse   . Bipolar 1 disorder     Past Surgical History  Procedure Laterality Date  . Unknown      Family History: No family history on file.  Social History:  reports that he has been smoking Cigarettes.  He has been smoking about 1.00 pack per day. He has never used smokeless tobacco. He reports that he drinks alcohol. He reports that he uses illicit drugs.  Additional Social History:  Alcohol /  Drug Use Pain Medications: none Prescriptions: none Over the Counter: none History of alcohol / drug use?: Yes Longest period of sobriety (when/how long): unknown Negative Consequences of Use: Financial;Personal relationships;Work / School Withdrawal Symptoms: Agitation;Patient aware of relationship between substance abuse and physical/medical complications;Weakness;Tremors;Irritability;Fever / Chills Substance #1 Name of Substance 1: Heroin 1 - Age of First Use: 18 1 - Amount (size/oz): $70 split between 3 people 1 - Frequency: daily over past week 1 - Duration: ongoing for 3 weeks 1 - Last Use / Amount: 05/21/14-30 cc  IV  CIWA:   COWS: Clinical Opiate Withdrawal Scale (COWS) Resting Pulse Rate: Pulse Rate 80 or below Sweating: Flushed or Observable moistness on face Restlessness: Able to sit still Pupil Size: Pupils pinned or normal size for room light Bone or Joint Aches: Mild diffuse discomfort Runny Nose or Tearing: Not present GI Upset: nausea or loose stool Tremor: Slight tremor observable Yawning: No yawning Anxiety or Irritability: Patient reports increasing irritability or anxiousness Gooseflesh Skin: Skin is smooth COWS Total Score: 8  PATIENT STRENGTHS: (choose at least two) Average or above average intelligence Capable of independent living Communication skills General fund of knowledge Motivation for treatment/growth Supportive family/friends Work skills  Allergies: No Known Allergies  Home Medications:  (Not in a hospital admission)  OB/GYN Status:  No LMP for male patient.  General Assessment Data Location of Assessment: [facility] Is this a Tele or Face-to-Face Assessment?: Face-to-Face Is this an Initial Assessment or a Re-assessment for this encounter?: Initial Assessment Living Arrangements: Non-relatives/Friends Can pt return to current living arrangement?: Yes Admission Status: Voluntary Is patient capable of signing voluntary admission?: Yes Transfer from: Home Referral Source: Self/Family/Friend  Medical Screening Exam [facility] Walk-in ONLY) Medical Exam completed: No Reason for MSE not completed: Other: (pt sent to [facility] for med clearance)  [facility] Crisis Care Plan Living Arrangements: Non-relatives/Friends Name of Psychiatrist: none Name of Therapist: none  Education Status Is patient currently in school?: No Highest grade of school patient has completed: 12  Risk to self with the past 6 months Suicidal Ideation: Yes-Currently Present Suicidal Intent: Yes-Currently Present Is patient at risk for suicide?: Yes Suicidal Plan?: No-Not  Currently/Within Last 6 Months Access to Means: No What has been your use of drugs/alcohol within the last 12 months?: pt reports daily use of IV heroin Previous Attempts/Gestures: Yes How many times?: 1 (2 nights ago, had a knife in bathtub to hurt self) Other Self Harm Risks: pt denies Triggers for Past Attempts: Family contact;Other (Comment) (PTSD, bereavement, SI, SA) Intentional Self Injurious Behavior: None Family Suicide History: Yes (Father committed suicide 3 weeks ago) Recent stressful life event(s): Loss (Comment);Financial Problems;Legal Issues;Recent negative physical changes;Trauma (Comment);Turmoil (Comment);Other (Comment) (SI, SA, father committed suicide, financial, legal) Persecutory voices/beliefs?: No Depression: Yes Depression Symptoms: Despondent;Insomnia;Tearfulness;Guilt;Loss of interest in usual pleasures;Feeling worthless/self pity;Feeling angry/irritable Substance abuse history and/or treatment for substance abuse?: Yes Suicide prevention information given to non-admitted patients: Not applicable  Risk to Others within the past 6 months Homicidal Ideation: No Thoughts of Harm to Others: No Current Homicidal Intent: No Current Homicidal Plan: No Access to Homicidal Means: No Identified Victim: na - pt denies History of harm to others?: No Assessment of Violence: None Noted Violent Behavior Description: na - pt irritable, but cooperative Does patient have access to weapons?: No Criminal Charges Pending?: Yes Describe Pending Criminal Charges: Shoplifting Does patient have a court date: Yes Court Date: 05/31/14  Psychosis Hallucinations: None noted Delusions: None noted  Mental Status  Report Appear/Hygiene: Disheveled;Body odor Eye Contact: Good Motor Activity: Tremors Speech: Logical/coherent;Pressured Level of Consciousness: Alert;Irritable Mood: Depressed;Anxious;Irritable Affect: Appropriate to circumstance Anxiety Level: Panic Attacks Panic  attack frequency: 2 x/week Most recent panic attack: yesterday Thought Processes: Coherent;Relevant Judgement: Impaired Orientation: Person;Place;Time;Situation;Appropriate for developmental age Obsessive Compulsive Thoughts/Behaviors: None  Cognitive Functioning Concentration: Decreased Memory: Recent Intact;Remote Intact IQ: Average Insight: Poor Impulse Control: Poor Appetite: Poor Weight Loss:  (pt is unsure) Weight Gain: 0 Sleep: Decreased Total Hours of Sleep: 4 Vegetative Symptoms: Decreased grooming;Not bathing  ADLScreening [facility] Assessment Services) Patient's cognitive ability adequate to safely complete daily activities?: Yes Patient able to express need for assistance with ADLs?: Yes Independently performs ADLs?: Yes (appropriate for developmental age)  Prior Inpatient Therapy Prior Inpatient Therapy: Yes Prior Therapy Dates: Age 18 Prior Therapy Facilty[place]Provider(s[place] [facility] Reason for Treatment: SA/Bereavement  Prior Outpatient Therapy Prior Outpatient Therapy: Yes Prior Therapy Dates: Age [age] Prior Therapy Facilty/Provider(s): [facility] Reason for Treatment: SA Treatment/Depression  ADL Screening (condition at time of admission) Patient's cognitive ability adequate to safely complete daily activities?: Yes Is the patient deaf or have difficulty hearing?: No Does the patient have difficulty seeing, even when wearing glasses/contacts?: No Does the patient have difficulty concentrating, remembering, or making decisions?: No Patient able to express need for assistance with ADLs?: Yes Does the patient have difficulty dressing or bathing?: No Independently performs ADLs?: Yes (appropriate for developmental age) Does the patient have difficulty walking or climbing stairs?: No  Home Assistive Devices/Equipment Home Assistive Devices/Equipment: None    Abuse/Neglect Assessment (Assessment to be complete while patient  is alone) Physical Abuse: Denies Verbal Abuse: Denies Sexual Abuse: Denies Exploitation of patient/patient's resources: Denies Self-Neglect: Denies Values / Beliefs Cultural Requests During Hospitalization: None Spiritual Requests During Hospitalization: None Consults Spiritual Care Consult Needed: No Social Work Consult Needed: No Merchant navy officerAdvance Directives (For Healthcare) Does patient have an advance directive?: No Would patient like information on creating an advanced directive?: No - patient declined information    Additional Information 1:1 In Past 12 Months?: No CIRT Risk: No Elopement Risk: No Does patient have medical clearance?: No     Disposition:  Disposition Initial Assessment Completed for this Encounter: Yes Disposition of Patient: Inpatient treatment program Type of inpatient treatment program: Adult (Pt accepted [facility])  Casimer LaniusKristen Nixie Laube, MS, [facility] Licensed Professional Counselor Triage Specialist  05/22/2014 3:40 PM

## 2014-05-22 NOTE — ED Provider Notes (Signed)
CSN: [id number]     Arrival date & time 05/22/14  1453 History   First MD Initiated Contact with Patient 05/22/14 1505     Chief Complaint  Patient presents with  . Medical Clearance     (Consider location/radiation/quality/duration/timing/severity/associated sxs/prior Treatment) HPI Comments: Last heroin use 2 days ago, has been using heroin, was recently here for heroin overdose where he was unresponsive.   Patient is a 18 y.o. male presenting with drug problem. The history is provided by the patient.  Drug Problem This is a recurrent problem. The current episode started more than 1 week ago. The problem occurs constantly. The problem has been gradually improving. Pertinent negatives include no chest pain, no abdominal pain and no shortness of breath. Nothing aggravates the symptoms. Nothing relieves the symptoms.    Past Medical History  Diagnosis Date  . Substance abuse   . Bipolar 1 disorder    Past Surgical History  Procedure Laterality Date  . Unknown     No family history on file. History  Substance Use Topics  . Smoking status: Current Every Day Smoker -- 1.00 packs/day    Types: Cigarettes  . Smokeless tobacco: Never Used  . Alcohol Use: Yes     Comment: occasionally     Review of Systems  Constitutional: Negative for fever and chills.  Respiratory: Negative for cough and shortness of breath.   Cardiovascular: Negative for chest pain.  Gastrointestinal: Negative for vomiting and abdominal pain.  All other systems reviewed and are negative.     Allergies  Review of patient's allergies indicates no known allergies.  Home Medications   Prior to Admission medications   Medication Sig Start Date End Date Taking? Authorizing Provider  divalproex (DEPAKOTE) 500 MG DR tablet Take 500-1,000 mg by mouth 2 (two) times daily. 500mg  in the morning and 1000mg  at night    Historical Provider, MD  lamoTRIgine (LAMICTAL) 25 MG tablet Take 75 mg by mouth daily.     Historical Provider, MD   BP 113/64  Pulse 129  Temp(Src) 98.1 F (36.7 C) (Oral)  Resp 18  SpO2 100% Physical Exam  Nursing note and vitals reviewed. Constitutional: He is oriented to person, place, and time. He appears well-developed and well-nourished. No distress.  HENT:  Head: Normocephalic and atraumatic.  Mouth/Throat: Oropharynx is clear and moist. No oropharyngeal exudate.  Eyes: EOM are normal. Pupils are equal, round, and reactive to light.  Neck: Normal range of motion. Neck supple.  Cardiovascular: Normal rate and regular rhythm.  Exam reveals no friction rub.   No murmur heard. Pulmonary/Chest: Effort normal and breath sounds normal. No respiratory distress. He has no wheezes. He has no rales.  Abdominal: He exhibits no distension. There is no tenderness. There is no rebound.  Musculoskeletal: Normal range of motion. He exhibits no edema.  Track marks in bilateral ACs, no cellulitis. Marked bruising in distal R anterior arm.  Neurological: He is alert and oriented to person, place, and time. No cranial nerve deficit. He exhibits normal muscle tone. Coordination normal.  Skin: He is not diaphoretic.    ED Course  Procedures (including critical care time) Labs Review Labs Reviewed  CBC - Abnormal; Notable for the following:    WBC 3.7 (*)    RDW 11.1 (*)    Platelets 129 (*)    All other components within normal limits  COMPREHENSIVE METABOLIC PANEL - Abnormal; Notable for the following:    Glucose, Bld 122 (*)  GFR calc non Af Amer 86 (*)    All other components within normal limits  SALICYLATE LEVEL - Abnormal; Notable for the following:    Salicylate Lvl <2.0 (*)    All other components within normal limits  URINE RAPID DRUG SCREEN (HOSP PERFORMED) - Abnormal; Notable for the following:    Opiates POSITIVE (*)    Benzodiazepines POSITIVE (*)    Amphetamines POSITIVE (*)    Tetrahydrocannabinol POSITIVE (*)    All other components within normal limits   ACETAMINOPHEN LEVEL  ETHANOL    Imaging Review No results found.   EKG Interpretation None      MDM   Final diagnoses:  Heroin abuse    2M here with heroin abuse - was sent from [facility] for medical clearance. Off heroin for 2 days. Shooting heroin in bilateral AC areas, multiple track marks and areas of bruising, no cellulitis. No SI. States he was suicidal, but his friends talked him out of it. Labs clear. Patient stable for return back to behavioral health.  Elwin Mocha, MD 05/23/14 [phone]

## 2014-05-22 NOTE — ED Notes (Signed)
Pt and his belongings have been wanded. [facility]

## 2014-05-22 NOTE — Discharge Instructions (Signed)
Opioid Withdrawal °Opioids are a group of narcotic drugs. They include the street drug heroin. They also include pain medicines, such as morphine, hydrocodone, oxycodone, and fentanyl. Opioid withdrawal is a group of characteristic physical and mental signs and symptoms. It typically occurs if you have been using opioids daily for several weeks or longer and stop using or rapidly decrease use. Opioid withdrawal can also occur if you have used opioids daily for a long time and are given a medicine to block the effect.  °SIGNS AND SYMPTOMS °Opioid withdrawal includes three or more of the following symptoms:  °· Depressed, anxious, or irritable mood. °· Nausea or vomiting. °· Muscle aches or spasms.   °· Watery eyes.    °· Runny nose. °· Dilated pupils, sweating, or hairs standing on end. °· Diarrhea or intestinal cramping. °· Yawning.   °· Fever. °· Increased blood pressure. °· Fast pulse. °· Restlessness or trouble sleeping. °These signs and symptoms occur within several hours of stopping or reducing short-acting opioids, such as heroin. They can occur within 3 days of stopping or reducing long-acting opioids, such as methadone. Withdrawal begins within minutes of receiving a drug that blocks the effects of opioids, such as naltrexone or naloxone. °DIAGNOSIS  °Opioid use disorder is diagnosed by your health care provider. You will be asked about your symptoms, drug and alcohol use, medical history, and use of medicines. A physical exam may be done. Lab tests may be ordered. Your health care provider may have you see a mental health professional.  °TREATMENT  °The treatment for opioid withdrawal is usually provided by medical doctors with special training in substance use disorders (addiction specialists). The following medicines may be included in treatment: °· Opioids given in place of the abused opioid. They turn on opioid receptors in the brain and lessen or prevent withdrawal symptoms. They are gradually  decreased (opioid substitution and taper). °· Non-opioids that can lessen certain opioid withdrawal symptoms. They may be used alone or with opioid substitution and taper. °Successful long-term recovery usually requires medicine, counseling, and group support. °HOME CARE INSTRUCTIONS  °· Take medicines only as directed by your health care provider. °· Check with your health care provider before starting new medicines. °· Keep all follow-up visits as directed by your health care provider. °SEEK MEDICAL CARE IF: °· You are not able to take your medicines as directed. °· Your symptoms get worse. °· You relapse. °SEEK IMMEDIATE MEDICAL CARE IF: °· You have serious thoughts about hurting yourself or others. °· You have a seizure. °· You lose consciousness. °Document Released: 09/20/2003 Document Revised: 02/01/2014 Document Reviewed: 09/30/2013 °ExitCare® Patient Information ©2015 [employer]. This information is not intended to replace advice given to you by your health care provider. Make sure you discuss any questions you have with your health care provider. ° °Emergency Department Resource Guide °1) Find a Doctor and Pay Out of Pocket °Although you won't have to find out who is covered by your insurance plan, it is a good idea to ask around and get recommendations. You will then need to call the office and see if the doctor you have chosen will accept you as a new patient and what types of options they offer for patients who are self-pay. Some doctors offer discounts or will set up payment plans for their patients who do not have insurance, but you will need to ask so you aren't surprised when you get to your appointment. ° °2) Contact Your Local Health Department °Not all health departments have   doctors that can see patients for sick visits, but many do, so it is worth a call to see if yours does. If you don't know where your local health department is, you can check in your phone book. The CDC also has a tool to  help you locate your state's health department, and many state websites also have listings of all of their local health departments. ° °3) Find a Walk-in Clinic °If your illness is not likely to be very severe or complicated, you may want to try a walk in clinic. These are popping up all over the country in pharmacies, drugstores, and shopping centers. They're usually staffed by nurse practitioners or physician assistants that have been trained to treat common illnesses and complaints. They're usually fairly quick and inexpensive. However, if you have serious medical issues or chronic medical problems, these are probably not your best option. ° °No Primary Care Doctor: °- Call [facility] at  832-8000 - they can help you locate a primary care doctor that  accepts your insurance, provides certain services, etc. °- Physician Referral Service- 1-800-533-3463 ° °Chronic Pain Problems: °Organization         Address  Phone   Notes  °[facility]  (336) 297-2271 Patients need to be referred by their primary care doctor.  ° °Medication Assistance: °Organization         Address  Phone   Notes  °[place] [facility] [address] °[place], [place] [address] (336) 641-8030 --Must be a resident of [place] °-- Must have NO insurance coverage whatsoever (no Medicaid/ Medicare, etc.) °-- The pt. MUST have a primary care doctor that directs their care regularly and follows them in the community °  °[facility]  (866) 331-1348   °[employer] Way  (888) 892-1162   ° °Agencies that provide inexpensive medical care: °Organization         Address  Phone   Notes  °[facility]  (336) 832-8035   °[facility]    (336) 832-7272   °[facility] [address] °[place], [place] [address] (336) 832-4777   °[facility] [address], °[place] (336) 271-4999   °[facility]    (336) 373-0678   °[facility]    (336) 272-1050   °[facility] ° [address], [place] Phone:  (336) 832-4444, Fax:  (336) 832-4440 Hours of Operation:  9 am - 6 pm, M-F.  Also accepts Medicaid/Medicare and self-pay.  °[facility] ° [address], [place] Phone: (336) 832-3150, Fax: (336) 832-3151. Hours of Operation:  8:30 am - 5:30 pm, M-F.  Also accepts Medicaid and self-pay.  °[facility] [place] [address], [place] Phone: (336) 878-6027   °[facility] [address], [place],  (336)723-1848, Ext. 123 Mondays & Thursdays: 7-9 AM.  First 15 patients are seen on a first come, first serve basis. °  ° °Medicaid-accepting [place] Providers: ° °Organization         Address  Phone   Notes  °[facility] [address], [place] (336) 641-2100 Also accepts self-pay patients.  °[facility] [address], [place] ° (336) 856-9996   °[facility] [address], [place] (336) 288-8857   °[facility] [address]   [place] [address], [place] (336) 299-7000   °Veita Bland [address], [place]  ° (336) 373-1557 Only accepts [place] Access Medicaid patients after they have their name applied to their card.  ° °Self-Pay (no insurance) in [place]: ° °Organization         Address  Phone   Notes  °Sickle Cell Patients, [facility] [address], [place] (336) 832-1970   °[facility] [address], [place] (336) 832-4400   °[facility] ° [address], [place] (336) 992-4800   °[facility]/Dr. Osei-Bonsu ° [address] [place] [address], [place] or [address], [place] (336) 841-8500 Phone number for both [place] and [place] locations is the same.  °[facility] [address],  [place] (336) 299-0000   °[facility] [address] [place] [address], [place] or [address] (336) 852-7530 °(336) 878-2260   °[facility] [address], [place] (336) 350-1642, phone; (336) 294-5005, fax Sees patients 1st and 3rd Saturday of every month.  Must not qualify for public or private insurance (i.e. Medicaid, Medicare, [place] Health Choice, Veterans' Benefits) • Household income should be no more than 200% of the poverty level •The clinic cannot treat you if you are pregnant or think you are pregnant • Sexually transmitted diseases are not treated at the clinic.  ° ° °Dental Care: °Organization         Address  Phone  Notes  °[place] [facility] [address], [place] (336) 641-6152 Accepts children up to age 21 who are enrolled in Medicaid or [place] Health Choice; pregnant women with a Medicaid card; and children who have applied for Medicaid or [place] Health Choice, but were declined, whose parents can pay a reduced fee at time of service.  °[facility] [place]  [address], [place] (336) 641-7733 Accepts children up to age 21 who are enrolled in Medicaid or [place] Health Choice; pregnant women with a Medicaid card; and children who have applied for Medicaid or [place] Health Choice, but were declined, whose parents can pay a reduced fee at time of service.  °[facility] ° [address], [place] (336) 641-4533 Patients are seen by appointment only. Walk-ins are not accepted. [facility] will see patients 18 years of age and older. °Monday - Tuesday (8am-5pm) °Most Wednesdays (8:30-5pm) °$30 per visit, cash only  °[facility] ° [address], [place] (336) 641-4533 Patients are seen by appointment only. Walk-ins are not accepted. [facility] will see patients 18 years of age and older. °One Wednesday Evening  (Monthly: Volunteer Based).  $30 per visit, cash only  °[facility]  (919) 537-3737 for adults; Children under age 4, call Graduate Pediatric Dentistry at (919) [phone]. Children aged 4-14, please call (919) [phone] to request a pediatric application. ° Dental services are provided in all areas of dental care including fillings, crowns and bridges, complete and partial dentures, implants, gum treatment, root canals, and extractions. Preventive care is also provided. Treatment is provided to both adults and children. °Patients are selected via a lottery and there is often a waiting list. °  °[facility] [address], °[place] ° (336) 763-8833 [email or web address] °  °[facility] [address], [place], [place] (336)723-1848, Ext. 123 Second and Fourth Thursday of each month, opens at 6:30 AM; Clinic ends at 9 AM.  Patients are seen on a first-come first-served basis, and a limited number are seen during each clinic.  ° °[facility] ° [address], [place], [place] (336) 723-7904   Eligibility Requirements °You must have lived in [place], [place], or [place] counties for at least the last three months. °  You cannot be eligible for state or federal sponsored healthcare insurance, including Veterans Administration, Medicaid, or Medicare. °  You generally cannot be eligible for healthcare insurance through your employer.  °  How to apply: °Eligibility screenings are held every Tuesday and Wednesday afternoon from 1:00 pm until 4:00 pm. You do not need an appointment for the interview!  °[facility] [address], [place], [place] 336-631-2330   °[facility]  336-342-8273   °[facility]  336-703-3100   °[facility]  336-570-6415   ° °Behavioral Health Resources in the Community: °Intensive Outpatient Programs °Organization         Address  Phone  Notes  °[place]  [facility] [address], [place], [place] 336-878-6098   °[facility] [address], [place], [place] 336-832-9800   °[facility] [address], [place], [place] ° 336-882-2125   °[facility] [address],  °[place], [place] 1-800-853-5163 or 336-641-4981   °Substance Abuse Resources °Organization         Address  Phone  Notes  °[facility]  336-882-2125   °[facility]  336-784-9470   °[facility]  336-285-9073   °[facility]  336-845-3988   °Residential & Outpatient Substance Abuse Program  1-800-659-3381   °Psychological Services °Organization         Address  Phone  Notes  °[facility]  336- 832-9600   °[facility]  336- 378-7881   °[facility] [address], [place] 1-800-853-5163 or 336-641-4981   ° °Mobile Crisis Teams °Organization         Address  Phone  Notes  °[facility], Mobile Crisis Care Unit  1-877-626-1772   °Assertive °[facility] ° [address] [place], [place] 336-834-9664   °Sharon DeEsch [address] °[place] 336-554-5454   ° °Self-Help/Support Groups °Organization         Address  Phone             Notes  °[facility] - variety of support groups  336- 373-1402 Call for more information  °Narcotics Anonymous (NA), [facility] [address], °[place]  2 meetings at this location  ° °Residential Treatment Programs °Organization         Address  Phone  Notes  °[facility] [address],    °[place]  1-866-801-8205   °[facility] ° [address], [place], [place] 704-293-8524   °[facility] [address], [place] 336-845-3988 Admissions: 8am-3pm M-F  °[facility] [address]    °[place], [place] 336-841-1104   °[facility] [address],  [place], [place] 336-379-7146   °[facility] [address]  °[place], [place] 336-285-9073   °[facility] - Intensive Outpatient [address], ,   [place] 336-852-3033   °[facility] [address]  °[place], [place] 1-877-615-2722 or 336-784-9470   °[facility] [address] [place], [place] 336-227-7417 Accepts Medicaid  °[facility] [address]  °[place] 1-800-659-3381 Substance Abuse/Addiction Treatment  ° °[place] Behavioral Health Resources °Organization         Address  Phone  Notes  °[facility]  (888) 581-9988   °Julie Brannon, PhD [address] [place], [place]   (336) 349-5553 or (336) 951-0000   °[facility]   [address] °[place], [place] (336) 349-4454   °[facility] [address], [place], [place] (336) 342-8316 Insurance/Medicaid/sponsorship through [facility]  °[facility] [address]                                    [place], [place] (336) 342-8316 Therapy/tele-psych/case  °[facility] [address]  ° [place], [place] (336) 349-2233    °Dr. Arfeen  (336) 349-4544   °[facility] [place]  [employer] [facility] [place] [facility] 1) [address], [place] °2) [address], [place] °3)  [address], [place] (336) 349-3220 °(336) 342-7768 ° °(336) 342-8140   °[place] [facility] (336) 342-1394 or (336) 342-3537 (After Hours)    ° ° °

## 2014-05-22 NOTE — Progress Notes (Signed)
Admission Note:  D:18 yr male who presents [facility] in no acute distress for the treatment of SA and depression. Pt appears flat and depressed. Pt was calm and cooperative with admission process. Pt presents with stated withdrawal symptoms and appears agitated. Pt has been non-compliant with medications x2 months due to no money. 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 Pt signed 72 hr for D/C 05/22/14 1927.   A:Pt says he is on disability, but works at the [employer]. Skin was assessed and found to be clear of any abnormal marks apart from track marks in forearms. POC and unit policies explained and understanding verbalized. Consents obtained. Food and fluids offered, and  Accepted.  R: Pt had no additional questions or concerns.

## 2014-05-22 NOTE — ED Notes (Signed)
Patient angry because [facility] and cannot be transferred at this time. Currently checking on bed status from Ronita Hippshris J., RN

## 2014-05-22 NOTE — ED Notes (Signed)
Pt from [facility].  Has a bed over at [facility] already. Just needs medical clearance.  2nd day w/o heroin.  SI.

## 2014-05-23 DIAGNOSIS — F4310 Post-traumatic stress disorder, unspecified: Secondary | ICD-10-CM

## 2014-05-23 MED ORDER — NICOTINE POLACRILEX 2 MG MT GUM
2.0000 mg | CHEWING_GUM | OROMUCOSAL | Status: DC | PRN
  Administered 2014-05-23 – 2014-05-25 (×6): 2 mg via ORAL
  Filled 2014-05-23: qty 1

## 2014-05-23 NOTE — Progress Notes (Signed)
D. Pt has been up and visible in milieu this evening, has attended and participated in some activities. Pt denies any depression or SI and has endorsed various withdrawal symptoms. Pt does appear anxious and fidgety on the hall and spoke about feeling agitated and aches allover. Pt has received medication without incident. Pt is concerned about how he has a cat in his apartment and requesting that we give his key to his sister tomorrow. A. Support and encouragement provided, explained to pt that request could be completed tomorrow when sister arrives. R. Safety maintained, will continue to monitor.
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
## 2014-05-23 NOTE — [facility] Suicide Risk Assessment (Signed)
   Nursing information obtained from:    Demographic factors:    Current Mental Status:    Loss Factors:    Historical Factors:    Risk Reduction Factors:    Total Time spent with patient: 45 minutes  CLINICAL FACTORS:   Alcohol/Substance Abuse/Dependencies  Psychiatric Specialty Exam: Physical Exam  ROS  Blood pressure 102/59, pulse 104, temperature 97.7 F (36.5 C), temperature source Oral, resp. rate 20, height 5' 8.75" (1.746 m), weight 55.339 kg (122 lb).Body mass index is 18.15 kg/(m^2).  General Appearance: Disheveled  Eye Contact::  Minimal  Speech:  Slow  Volume:  Decreased  Mood:  Dysphoric  Affect:  Congruent and Depressed  Thought Process:  Goal Directed  Orientation:  Full (Time, Place, and Person)  Thought Content:  Negative  Suicidal Thoughts:  No  Homicidal Thoughts:  No  Memory:  Negative  Judgement:  Poor  Insight:  Shallow  Psychomotor Activity:  Decreased  Concentration:  Poor  Recall:  Fair  Fund of Knowledge:Fair  Language: Fair  Akathisia:  Negative  Handed:  Right  AIMS (if indicated):     Assets:  Communication Skills Desire for Improvement Financial Resources/Insurance Physical Health  Sleep:  Number of Hours: 6.75   Musculoskeletal: Strength & Muscle Tone: within normal limits Gait & Station: normal Patient leans: N/A  COGNITIVE FEATURES THAT CONTRIBUTE TO RISK:  Loss of executive function    SUICIDE RISK:   Moderate:  Frequent suicidal ideation with limited intensity, and duration, some specificity in terms of plans, no associated intent, good self-control, limited dysphoria/symptomatology, some risk factors present, and identifiable protective factors, including available and accessible social support.  PLAN OF CARE:  I certify that inpatient services furnished can reasonably be expected to improve the patient's condition.  Spencer Rogers 05/23/2014, 9:08 AM

## 2014-05-23 NOTE — [facility] Group Notes (Signed)
[facility] Group Notes:  (Clinical Social Work)  05/23/2014   1:15-2:15PM  Summary of Progress/Problems:  The main focus of today's process group was to   identify the patient's current support system and decide on other supports that can be put in place.  The picture on workbook was used to discuss why additional supports are needed.  An emphasis was placed on using counselor, doctor, therapy groups, 12-step groups, and problem-specific support groups to expand supports.   There was also an extensive discussion about what constitutes a healthy support versus an unhealthy support.  The patient expressed that he has a lot of good supports in his life, but that he has problems using his supports, because he keeps his problems hidden from other people.  During group, he was very irritable about being in the hospital, and kept talking about the many problems this hospitalization is going to cause him, including loss of his only possessions in the world.  He stated that he has been shooting up with his mother-figure and her son, and that his possessions are in the hotel room with them.  He does not know what they are going to do with those things.  The patient said those are the last things his father gave him prior to committing suicide 3 weeks ago, and he has no way of replacing them.  He was angry, cursing, and denied every suggestion made to him as being unhelpful.  Type of Therapy:  Process Group  Participation Level:  Active  Participation Quality:  Resistant and Sharing  Affect:  Flat and Depressed  Cognitive:  Lacking  Insight:  Limited  Engagement in Therapy:  Limited  Modes of Intervention:  Education,  Support and [employer], LCSW 05/23/2014, 4:00pm

## 2014-05-23 NOTE — Progress Notes (Signed)
Psychoeducational Group Note  Date: 05/23/2014 Time:   1045  Group Topic/Focus:  Making Healthy Choices:   The focus of this group is to help patients identify negative/unhealthy choices they were using prior to admission and identify positive/healthier coping strategies to replace them upon discharge.  Participation Level:  Active  Participation Quality:  Attentive  Affect:  Defensive  Cognitive:  Disorganized  Insight:  Engaged  Engagement in Group:  Engaged  Additional Comments:    05/23/2014,3:45 PM Caton Popowski, Joie Bimler

## 2014-05-23 NOTE — H&P (Signed)
Psychiatric Admission Assessment Adult  Patient Identification:  Spencer Rogers Date of Evaluation:  05/23/2014 Chief Complaint:  OPIOID ABUSE PTSD History of Present Illness:: Spencer Rogers is an 18 y.o. male that presented as a walk-in to [facility] via family friends. Pt presenting with SI and requesting detox from heroin. Pt reported he began using heroin after he walked in on his father that committed suicide by shooting himself in the head. Pt is the one that found him in his home. Pt stated he has been sharing $70 between 3 people of IV heroin daily, last use was 30 cc (used 7 x that day) per pt. Pt reported he has experimented with alcohol and drugs in the past, but not with heroin. Pt stated, "I am so scared." Pt reported current withdrawal sx include chills, feeling sweaty, moist skin, nausea, and tremor. Pt denies the use of other drugs or alcohol. However, notes from ED in [facility] show 2 overdoses over the past 2 months, and pt reported using several substances at that time. Last visit to ED for overdose was 05/01/14 and prior visit 04/27/14. Pt endorses paranoia, being scared to go to sleep because he is afraid he will see his dead father or mother (his mother died at age 89), keeps reliving and playing th scene in his head of the aftermath of his father's suicide, insomnia, and anxiety (including panic attacks). Pt stated the only treatment he has had in the past was going to a wilderness camp for SA at age 12 and also to deal with bereavement and then participation in [facility] for 3 years. Pt was living with his sister, but she kicked him out by report for drug use. He is working PT at [employer], but report current stressors are losing his apartment, his father's death, limited family support, and an upcoming court date on 05/31/14 for stealing a video game from [place]. Pt denies SI for the past 24 hours, because the heroin had cleared. He denies HI or psychosis.   Elements:   Location:  depression. Quality:  severe. Severity:  severe. Timing:  since mother died 8 years ago. Duration:  8 years. Context:  father committed suicide 3 weeks ago. Associated Signs/Synptoms: Depression Symptoms:  depressed mood, anhedonia, insomnia, psychomotor retardation, feelings of worthlessness/guilt, difficulty concentrating, hopelessness, (Hypo) Manic Symptoms:   Anxiety Symptoms:  Psychotic Symptoms:  PTSD Symptoms: Had a traumatic exposure in the last month:  witnessed the aftermath of father's suicide Re-experiencing:  Flashbacks Intrusive Thoughts Nightmares Hypervigilance:  Yes Hyperarousal:  Difficulty Concentrating Emotional Numbness/Detachment Irritability/Anger Sleep Avoidance:  Decreased Interest/Participation Foreshortened Future Total Time spent with patient: 45 minutes  Psychiatric Specialty Exam: Physical Exam  ROS  Blood pressure 102/59, pulse 104, temperature 97.7 F (36.5 C), temperature source Oral, resp. rate 20, height 5' 8.75" (1.746 m), weight 55.339 kg (122 lb).Body mass index is 18.15 kg/(m^2).  General Appearance: Disheveled and Guarded  Eye Contact::  Poor  Speech:  Slow  Volume:  Decreased  Mood:  Depressed  Affect:  Depressed and Restricted  Thought Process:  Coherent and Goal Directed  Orientation:  Full (Time, Place, and Person)  Thought Content:  Negative  Suicidal Thoughts:  No  Homicidal Thoughts:  No  Memory:  Negative  Judgement:  Poor  Insight:  Shallow  Psychomotor Activity:  Decreased  Concentration:  Poor  Recall:  [place] of Knowledge:Fair  Language: Fair  Akathisia:  Negative  Handed:  Right  AIMS (if indicated):     Assets:  Communication Skills Desire for Improvement Financial Resources/Insurance Physical Health Social Support  Sleep:  Number of Hours: 6.75    Musculoskeletal: Strength & Muscle Tone: within normal limits Gait & Station: normal Patient leans: N/A  Past Psychiatric  History: Diagnosis: depression and bipolar, per pt report  Hospitalizations: no prior  Outpatient Care: Dr. Clovis Pu ([facility])  Substance Abuse Care: 18 y/o [employer] program, then [facility])  Self-Mutilation: none  Suicidal Attempts: pt reports overdosing on heroin in past to "get high, not to commit suicide"  Violent Behaviors: none   Past Medical History:   Past Medical History  Diagnosis Date  . Substance abuse   . Bipolar 1 disorder    None. Allergies:  No Known Allergies PTA Medications: Prescriptions prior to admission  Medication Sig Dispense Refill  . divalproex (DEPAKOTE) 500 MG DR tablet Take 500-1,000 mg by mouth 2 (two) times daily. $RemoveBefo'500mg'KPIYpsdReuL$  in the morning and $RemoveBef'1000mg'FxsBmgOXZk$  at night      . lamoTRIgine (LAMICTAL) 25 MG tablet Take 75 mg by mouth daily.        Previous Psychotropic Medications:  Medication/Dose  depakote  lamictal             Substance Abuse History in the last 12 months:  Yes.    Consequences of Substance Abuse: Medical Consequences:  denies Legal Consequences:  denies Family Consequences:  sister kicked out of house. uncle disowned him Withdrawal Symptoms:   Cramps leg aches, low back pain  Social History:  reports that he has been smoking Cigarettes.  He has a 10 pack-year smoking history. He has never used smokeless tobacco. He reports that he drinks alcohol. He reports that he uses illicit drugs (Marijuana, Heroin, and Benzodiazepines). Additional Social History: Pain Medications: none Prescriptions: none Over the Counter: none History of alcohol / drug use?: Yes Longest period of sobriety (when/how long): unknown Negative Consequences of Use: Financial;Personal relationships;Work / School Withdrawal Symptoms: Agitation;Patient aware of relationship between substance abuse and physical/medical complications;Weakness;Tremors;Irritability;Fever / Chills Name of Substance 1: Heroin 1 - Age of First Use: 18 1 - Amount  (size/oz): $70 split between 3 people 1 - Frequency: daily over past week 1 - Duration: ongoing for 3 weeks 1 - Last Use / Amount: 05/21/14-30 cc IV      He denies regular daily BZD use. He last used xanax 2 mg (2 days ago), to decrease withdrawal anxiety (so he could work).             Current Place of Residence:   Place of Birth:   Family Members: Marital Status:  Single Children:  Sons:  Daughters: Relationships: Education:  [employer] Problems/Performance: Religious Beliefs/Practices: History of Abuse (Emotional/Phsycial/Sexual) Ship broker History:  None. Legal History: Hobbies/Interests:  Family History:  History reviewed. No pertinent family history.  Results for orders placed during the hospital encounter of 05/22/14 (from the past 72 hour(s))  ACETAMINOPHEN LEVEL     Status: None   Collection Time    05/22/14  3:08 PM      Result Value Ref Range   Acetaminophen (Tylenol), Serum <15.0  10 - 30 ug/mL   Comment:            THERAPEUTIC CONCENTRATIONS VARY     SIGNIFICANTLY. A RANGE OF 10-30     ug/mL MAY BE AN EFFECTIVE     CONCENTRATION FOR MANY PATIENTS.     HOWEVER, SOME ARE BEST TREATED  AT CONCENTRATIONS OUTSIDE THIS     RANGE.     ACETAMINOPHEN CONCENTRATIONS     >150 ug/mL AT 4 HOURS AFTER     INGESTION AND >50 ug/mL AT 12     HOURS AFTER INGESTION ARE     OFTEN ASSOCIATED WITH TOXIC     REACTIONS.  CBC     Status: Abnormal   Collection Time    05/22/14  3:08 PM      Result Value Ref Range   WBC 3.7 (*) 4.0 - 10.5 K/uL   RBC 4.38  4.22 - 5.81 MIL/uL   Hemoglobin 13.7  13.0 - 17.0 g/dL   HCT 40.4  39.0 - 52.0 %   MCV 92.2  78.0 - 100.0 fL   MCH 31.3  26.0 - 34.0 pg   MCHC 33.9  30.0 - 36.0 g/dL   RDW 11.1 (*) 11.5 - 15.5 %   Platelets 129 (*) 150 - 400 K/uL  COMPREHENSIVE METABOLIC PANEL     Status: Abnormal   Collection Time    05/22/14  3:08 PM      Result Value Ref Range   Sodium 138  137 - 147  mEq/L   Potassium 4.0  3.7 - 5.3 mEq/L   Chloride 102  96 - 112 mEq/L   CO2 22  19 - 32 mEq/L   Glucose, Bld 122 (*) 70 - 99 mg/dL   BUN 13  6 - 23 mg/dL   Creatinine, Ser 1.20  0.50 - 1.35 mg/dL   Calcium 9.6  8.4 - 10.5 mg/dL   Total Protein 6.7  6.0 - 8.3 g/dL   Albumin 4.0  3.5 - 5.2 g/dL   AST 26  0 - 37 U/L   ALT 15  0 - 53 U/L   Alkaline Phosphatase 53  39 - 117 U/L   Total Bilirubin 0.4  0.3 - 1.2 mg/dL   GFR calc non Af Amer 86 (*) >90 mL/min   GFR calc Af Amer >90  >90 mL/min   Comment: (NOTE)     The eGFR has been calculated using the CKD EPI equation.     This calculation has not been validated in all clinical situations.     eGFR's persistently <90 mL/min signify possible Chronic Kidney     Disease.   Anion gap 14  5 - 15  ETHANOL     Status: None   Collection Time    05/22/14  3:08 PM      Result Value Ref Range   Alcohol, Ethyl (B) <11  0 - 11 mg/dL   Comment:            LOWEST DETECTABLE LIMIT FOR     SERUM ALCOHOL IS 11 mg/dL     FOR MEDICAL PURPOSES ONLY  SALICYLATE LEVEL     Status: Abnormal   Collection Time    05/22/14  3:08 PM      Result Value Ref Range   Salicylate Lvl <7.8 (*) 2.8 - 20.0 mg/dL  URINE RAPID DRUG SCREEN (HOSP PERFORMED)     Status: Abnormal   Collection Time    05/22/14  3:14 PM      Result Value Ref Range   Opiates POSITIVE (*) NONE DETECTED   Cocaine NONE DETECTED  NONE DETECTED   Benzodiazepines POSITIVE (*) NONE DETECTED   Amphetamines POSITIVE (*) NONE DETECTED   Tetrahydrocannabinol POSITIVE (*) NONE DETECTED   Barbiturates NONE DETECTED  NONE DETECTED   Comment:  DRUG SCREEN FOR MEDICAL PURPOSES     ONLY.  IF CONFIRMATION IS NEEDED     FOR ANY PURPOSE, NOTIFY LAB     WITHIN 5 DAYS.                LOWEST DETECTABLE LIMITS     FOR URINE DRUG SCREEN     Drug Class       Cutoff (ng/mL)     Amphetamine      1000     Barbiturate      200     Benzodiazepine   200     Tricyclics       300     Opiates           300     Cocaine          300     THC              50   Psychological Evaluations:  Assessment:   DSM5:  Schizophrenia Disorders:   Obsessive-Compulsive Disorders:  Trauma-Stressor Disorders:  Posttraumatic Stress Disorder (309.81) Substance/Addictive Disorders:  Cannabis Use Disorder - Mild (305.20) and Opioid Disorder - Severe (304.00) Depressive Disorders:   AXIS I:  Post Traumatic Stress Disorder, Substance Abuse and Substance Induced Mood Disorder AXIS II:  Deferred AXIS III:   Past Medical History  Diagnosis Date  . Substance abuse   . Bipolar 1 disorder    AXIS IV:  other psychosocial or environmental problems AXIS V:  21-30 behavior considerably influenced by delusions or hallucinations OR serious impairment in judgment, communication OR inability to function in almost all areas  Treatment Plan/Recommendations:  Admit for safety/stabilization/opioid detox.  Treatment Plan Summary: Daily contact with patient to assess and evaluate symptoms and progress in treatment Medication management Current Medications:  Current Facility-Administered Medications  Medication Dose Route Frequency Provider Last Rate Last Dose  . acetaminophen (TYLENOL) tablet 650 mg  650 mg Oral Q6H PRN Kristeen Mans, NP      . alum & mag hydroxide-simeth (MAALOX/MYLANTA) 200-200-20 MG/5ML suspension 30 mL  30 mL Oral Q4H PRN Kristeen Mans, NP      . cloNIDine (CATAPRES) tablet 0.1 mg  0.1 mg Oral QID Kristeen Mans, NP   0.1 mg at 05/22/14 2145   Followed by  . [START ON 05/25/2014] cloNIDine (CATAPRES) tablet 0.1 mg  0.1 mg Oral BH-qamhs Kristeen Mans, NP       Followed by  . [START ON 05/28/2014] cloNIDine (CATAPRES) tablet 0.1 mg  0.1 mg Oral QAC breakfast Kristeen Mans, NP      . dicyclomine (BENTYL) tablet 20 mg  20 mg Oral Q6H PRN Kristeen Mans, NP   20 mg at 05/22/14 2146  . hydrOXYzine (ATARAX/VISTARIL) tablet 25 mg  25 mg Oral Q6H PRN Kristeen Mans, NP   25 mg at 05/22/14 2145  . loperamide  (IMODIUM) capsule 2-4 mg  2-4 mg Oral PRN Kristeen Mans, NP      . magnesium hydroxide (MILK OF MAGNESIA) suspension 30 mL  30 mL Oral Daily PRN Kristeen Mans, NP      . methocarbamol (ROBAXIN) tablet 500 mg  500 mg Oral Q8H PRN Kristeen Mans, NP   500 mg at 05/22/14 2146  . naproxen (NAPROSYN) tablet 500 mg  500 mg Oral BID PRN Kristeen Mans, NP   500 mg at 05/22/14 2146  . ondansetron (ZOFRAN-ODT) disintegrating tablet 4 mg  4 mg  Oral Q6H PRN Lurena Nida, NP   4 mg at 05/22/14 2145  . traZODone (DESYREL) tablet 50 mg  50 mg Oral QHS PRN Lurena Nida, NP        Observation Level/Precautions:  Detox  Laboratory:  completed in ED  Psychotherapy:  [facility] therapy  Medications:  Opioid detox protocol for now. consider starting antidepressant in the next 1-2 days (once withdrawal symptoms improved). Will need to clarify bipolar history, by obtaining collateral info from outpt psychiatrist and family.  Consultations:    Discharge Concerns:  Will likely need rehab program  Estimated LOS: 5-7 days  Other:     I certify that inpatient services furnished can reasonably be expected to improve the patient's condition.   Leslie, Loleta Dicker 8/23/20158:33 AM

## 2014-05-23 NOTE — [facility] Group Notes (Signed)
0900 nursing orientation    The focus of this group is to educate the patient on the purpose and policies of crisis stabilization and provide a format to answer questions about their admission.  The group details unit policies and expectations of patients while admitted.[   Pt did not attend he was in with the doctor.

## 2014-05-24 DIAGNOSIS — F1994 Other psychoactive substance use, unspecified with psychoactive substance-induced mood disorder: Secondary | ICD-10-CM

## 2014-05-24 DIAGNOSIS — F1910 Other psychoactive substance abuse, uncomplicated: Secondary | ICD-10-CM

## 2014-05-24 NOTE — [facility] Suicide Risk Assessment (Signed)
[facility] INPATIENT:  Family/Significant Other Suicide Prevention Education  Suicide Prevention Education:  Education Completed; Hodari Chuba, [phone]; has been identified by the patient as the family member/significant other with whom the patient will be residing, and identified as the person(s) who will aid the patient in the event of a mental health crisis (suicidal ideations/suicide attempt).  With written consent from the patient, the family member/significant other has been provided the following suicide prevention education, prior to the and/or following the discharge of the patient.  The suicide prevention education provided includes the following:  Suicide risk factors  Suicide prevention and interventions  National Suicide Hotline telephone number  [facility] assessment telephone number  [facility] Emergency Assistance 911  [facility] and/or Residential Mobile Crisis Unit telephone number  Request made of family/significant other to:  Remove weapons (e.g., guns, rifles, knives), all items previously/currently identified as safety concern.   Sister advised patient does not have access to weapons.    Remove drugs/medications (over-the-counter, prescriptions, illicit drugs), all items previously/currently identified as a safety concern.  The family member/significant other verbalizes understanding of the suicide prevention education information provided.  The family member/significant other agrees to remove the items of safety concern listed above.  Wynn Banker 05/24/2014, 1:04 PM

## 2014-05-24 NOTE — Progress Notes (Signed)
D: Patient in the hallway on approach.  Patient states he had a good day.  Patient states he is happy because he will be discharged tomorrow.  Patient states he has come up with a plan so if he is stressed and feels like using he will not.  Patient states he is going to include his support system and go to NA and AA groups.  Patient states she wants to get better.  Patient denies SI/HI and denies AVH. A: Staff to monitor Q 15 mins for safety.  Encouragement and support offered.  Scheduled medications administered per orders. R: Patient remains safe on the unit.  Patient attended AA group tonight.  Patient visible on the unit.  Patient taking administered medications.

## 2014-05-24 NOTE — [facility] Group Notes (Signed)
[facility] LCSW Group Therapy 05/24/2014  1:15 pm  Type of Therapy: Group Therapy Participation Level: Active  Participation Quality: Attentive, Sharing and Supportive  Affect: Depressed and Flat  Cognitive: Alert and Oriented  Insight: Developing/Improving and Engaged  Engagement in Therapy: Developing/Improving and Engaged  Modes of Intervention: Clarification, Confrontation, Discussion, Education, Exploration,  Limit-setting, Orientation, Problem-solving, Rapport Building, Dance movement psychotherapist, Socialization and Support  Summary of Progress/Problems: Pt identified obstacles faced currently and processed barriers involved in overcoming these obstacles. Pt identified steps necessary for overcoming these obstacles and explored motivation (internal and external) for facing these difficulties head on. Pt further identified one area of concern in their lives and chose a goal to focus on for today. Patient identified finances as a primary obstacle. Other CSW assisted patient in exploring how he could use his money more efficiently on paying off debt rather than towards his addiction. Patient reports "that gives me hope". CSW provided patient with emotional support and encouragement.  Samuella Bruin, MSW, [employer] Clinical Social Worker [facility] [phone]

## 2014-05-24 NOTE — Progress Notes (Signed)
[facility] MD Progress Note  05/24/2014 5:17 PM Spencer Rogers  MRN:  [id number] Subjective:  Spencer Rogers endorses that after he found his father dead (he shot himself) he started having flashbacks. He admits he started seein images of the blood, the brain matter that was there when he uncovered his body. He was able to abstain for few days could not handle he pain, relapsed OD on heroin. He states he could not go to the funeral. He was D/C from the medical unit but he was taken back to the motel where he was staying and using. He relapsed shortly afterwards. He again OD. State she really wants to get his life back together. As a child he admits he was diagnosed with ADHD to begin with and prescribed stimulants that he did not particularly liked. His mother died when he was 13 after a prolonged illness (ALS) states that after she died shorlty afterwards she started using. He has been in a residential treatment program and then in [facility]. States from here he does not want rehab. Will like to see a substance abuse counselor. He is not necessarily keen of being prescribed psychotropics. 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   HENT: Negative.   Eyes: Negative.   Respiratory: Negative.   Cardiovascular: Negative.   Gastrointestinal: Negative.   Genitourinary: Negative.   Musculoskeletal: Positive for myalgias.  Skin: Negative.   Endo/Heme/Allergies: Negative.   Psychiatric/Behavioral: Positive for depression  and substance abuse. The patient is nervous/anxious.     Blood pressure 96/55, pulse 82, temperature 97.6 F (36.4 C), temperature source Oral, resp. rate 16, height 5' 8.75" (1.746 m), weight 55.339 kg (122 lb).Body mass index is 18.15 kg/(m^2).  General Appearance: Fairly Groomed  Patent attorney::  Fair  Speech:  clear and coherent rapid  Volume:  fluctuates  Mood:  Anxious and worried  Affect:  anxious worried  Thought Process:  Coherent and Goal Directed  Orientation:  Full (Time, Place, and Person)  Thought Content:  symptoms events worries concerns  Suicidal Thoughts:  No  Homicidal Thoughts:  No  Memory:  Immediate;   Fair Recent;   Fair Remote;   Fair  Judgement:  Fair  [facility]:  Present  Psychomotor Activity:  Restlessness  Concentration:  Fair  Recall:  [employer] of Knowledge:NA  Language: Fair  Akathisia:  No  Handed:    AIMS (if indicated):     Assets:  Desire for Improvement Social Support  Sleep:  Number of Hours: 6.75   Musculoskeletal: Strength & Muscle Tone: within normal limits Gait & Station: normal Patient leans: N/A  Current Medications: Current Facility-Administered Medications  Medication Dose Route Frequency Provider Last Rate Last Dose  . acetaminophen (TYLENOL) tablet 650 mg  650 mg Oral Q6H PRN Kristeen Mans, NP      . alum & mag hydroxide-simeth (MAALOX/MYLANTA) 200-200-20 MG/5ML suspension 30 mL  30 mL Oral Q4H PRN Kristeen Mans, NP      . cloNIDine (CATAPRES) tablet 0.1 mg  0.1 mg Oral QID Kristeen Mans, NP  0.1 mg at 05/24/14 0801   Followed by  . [START ON 05/25/2014] cloNIDine (CATAPRES) tablet 0.1 mg  0.1 mg Oral BH-qamhs Kristeen Mans, NP       Followed by  . [START ON 05/28/2014] cloNIDine (CATAPRES) tablet 0.1 mg  0.1 mg Oral QAC breakfast Kristeen Mans, NP      . dicyclomine (BENTYL) tablet 20 mg  20 mg Oral Q6H PRN Kristeen Mans, NP   20 mg at 05/22/14 2146  . hydrOXYzine (ATARAX/VISTARIL) tablet 25 mg  25 mg Oral Q6H PRN Kristeen Mans, NP   25 mg at 05/23/14 1728  . loperamide (IMODIUM) capsule 2-4 mg  2-4 mg Oral PRN Kristeen Mans, NP      . magnesium hydroxide (MILK OF MAGNESIA) suspension 30 mL  30 mL Oral Daily PRN Kristeen Mans, NP   30 mL at 05/24/14 1255  . methocarbamol (ROBAXIN) tablet 500 mg  500 mg Oral Q8H PRN Kristeen Mans, NP   500 mg at 05/23/14 1728  . naproxen (NAPROSYN) tablet 500 mg  500 mg Oral BID PRN Kristeen Mans, NP   500 mg at 05/24/14 4098  . nicotine polacrilex (NICORETTE) gum 2 mg  2 mg Oral PRN Nehemiah Massed, MD   2 mg at 05/23/14 2138  . ondansetron (ZOFRAN-ODT) disintegrating tablet 4 mg  4 mg Oral Q6H PRN Kristeen Mans, NP   4 mg at 05/22/14 2145  . traZODone (DESYREL) tablet 50 mg  50 mg Oral QHS PRN Kristeen Mans, NP   50 mg at 05/23/14 2138    Lab Results: No results found for this or any previous visit (from the past 48 hour(s)).  Physical Findings: AIMS: Facial and Oral Movements Muscles of Facial Expression: None, normal Lips and Perioral Area: None, normal Jaw: None, normal Tongue: None, normal,Extremity Movements Upper (arms, wrists, hands, fingers): None, normal Lower (legs, knees, ankles, toes): None, normal, Trunk Movements Neck, shoulders, hips: None, normal, Overall Severity Severity of abnormal movements (highest score from questions above): None, normal Incapacitation due to abnormal movements: None, normal Patient's awareness of abnormal movements (rate only patient's report): No Awareness, Dental Status Current problems with teeth and/or dentures?: No Does patient usually wear dentures?: No  CIWA:  CIWA-Ar Total: 0 COWS:  COWS Total Score: 0  Treatment Plan Summary: Daily contact with patient to assess and evaluate symptoms and progress in treatment Medication management  Plan: Supportive approach/coping skills/relapse prevention           Continue the detox           Reassess and address the co morbidities           Grief-loss           Ask to consider  a residential treatment program  Medical Decision Making Problem Points:  Review of psycho-social stressors (1) Data Points:  Review of medication regiment & side effects (2)  I certify that inpatient services furnished can reasonably be expected to improve the patient's condition.   Lilly Gasser A 05/24/2014, 5:17 PM

## 2014-05-24 NOTE — Progress Notes (Signed)
NUTRITION ASSESSMENT  Pt identified as at risk on the Malnutrition Screen Tool  Pt meets criteria for severe MALNUTRITION in the context of social environment/circumstance as evidenced by PO intake < 75% for > 3 months, 13% body weight loss in < 6 months.   INTERVENTION: 1. Educated patient on the importance of nutrition and encouraged intake of food and beverages. 2. Discussed weight goals. 3. Supplements: Meals + Snacks and stool regimen  NUTRITION DIAGNOSIS: Unintentional weight loss related to sub-optimal intake as evidenced by pt report.   Goal: Pt to meet >/= 90% of their estimated nutrition needs.  Monitor:  PO intake  Assessment:  -Pt reported consuming one meal/day. Lowest weight has been 110 lbs, and he has been trying to regain wt. Has poor appetite while on heroin and is financially unable to afford several meals/day.  -Consumed instant noodles and many pre-packaged convenience foods. RD encouraged affordable food/meal options, and local resources for meal assistance (soup kitchen, homeless shelter, etc) -Has had excellent PO intake, eating 3 meals, and several snacks. Denied nausea post meals.  -Has constipation with is causing some abd discomfort. No BMS for 2 days. Received milk of magnesia. RD reviewed constipation nutrition therapy, pt verbalized understanding  18 y.o. male  Height: Ht Readings from Last 1 Encounters:  05/22/14 5' 8.75" (1.746 m)    Weight: Wt Readings from Last 1 Encounters:  05/22/14 122 lb (55.339 kg)    Weight Hx: Wt Readings from Last 10 Encounters:  05/22/14 122 lb (55.339 kg)  05/18/14 135 lb (61.236 kg)    BMI:  Body mass index is 18.15 kg/(m^2). Pt meets criteria for Underweight based on current BMI.  Estimated Nutritional Needs: Kcal: 25-30 kcal/kg Protein: > 1 gram protein/kg Fluid: 1 ml/kcal  Diet Order: General Pt is also offered choice of unit snacks mid-morning and mid-afternoon.  Pt is eating as desired.   Lab  results and medications reviewed.   Lloyd Huger MS RD LDN Clinical Dietitian Pager:[phone]

## 2014-05-24 NOTE — B[facility] Group Notes (Addendum)
[facility] LCSW Aftercare Discharge Planning Group Note   05/24/2014 12:18 PM    Participation Quality:  Appropraite  Mood/Affect:  Appropriate  Depression Rating:  2  Anxiety Rating:  1  Thoughts of Suicide:  No  Will you contract for safety?   NA  Current AVH:  No  Plan for Discharge/Comments:  Patient attended discharge planning group and actively participated in group.  He reports being much better today.  He advised of admitting to hospital with SI but denies at this time.  He also shared he has been using large quantities of Heroin.  Patient advised of not being interested in residential and CD-IOP, at this time but will follow up for outpatient services through [facility] and NA.  CSW provided all participants with daily workbook.   Transportation Means: Patient has transportation.   Supports:  Patient has a support system.   Monserrat Vidaurri, Joesph July

## 2014-05-24 NOTE — BHH Group Notes (Signed)
Adult Psychoeducational Group Note  Date:  05/24/2014 Time:  10:12 PM  Group Topic/Focus:  Wrap-Up Group:   The focus of this group is to help patients review their daily goal of treatment and discuss progress on daily workbooks.  Participation Level:  Did Not Attend  Participation Quality:  did not attend  Affect:  did not attend  Cognitive:  did not attend  [facility]: None  Engagement in Group:  did not attend  Modes of Intervention:  did not attend  Additional Comments:  Attended AA group  Margy Clarks 05/24/2014, 10:12 PM

## 2014-05-24 NOTE — Progress Notes (Signed)
The patient attended the  A.A. Meeting and was appropriate.   

## 2014-05-24 NOTE — Progress Notes (Signed)
Assumed care of pt at 2300.  Pt has been resting in room, appears to be sleeping.  Respirations are even, unlabored, WNL.  Pt is in no acute distress.  Will continue to monitor and assess for safety.

## 2014-05-24 NOTE — Progress Notes (Signed)
NSG shift assessment. 7a-7p.   D: Affect blunted, mood depressed, behavior appropriate. Attends groups and participates. Cooperative with staff and is getting along well with peers. Reports reduction in withdrawal symptoms. Goal today is to be happy and prepare for discharge. To meet his goal he will be social and voice his feelings.   A: Observed pt interacting in group and in the milieu: Support and encouragement offered. Safety maintained with observations every 15 minutes.   R:   Contracts for safety and continues to follow the treatment plan, working on learning new coping skills.

## 2014-05-25 DIAGNOSIS — F1120 Opioid dependence, uncomplicated: Principal | ICD-10-CM

## 2014-05-25 MED ORDER — HYDROXYZINE HCL 25 MG PO TABS: ORAL_TABLET | ORAL | Status: DC

## 2014-05-25 MED ORDER — HYDROXYZINE HCL 25 MG PO TABS
25.0000 mg | ORAL_TABLET | Freq: Three times a day (TID) | ORAL | Status: DC | PRN
Start: 2014-05-25 — End: 2014-05-25
  Filled 2014-05-25: qty 10

## 2014-05-25 MED ORDER — TRAZODONE HCL 50 MG PO TABS: 50.0000 mg | ORAL_TABLET | Freq: Every evening | ORAL | Status: DC | PRN

## 2014-05-25 NOTE — [facility] Suicide Risk Assessment (Signed)
Suicide Risk Assessment  Discharge Assessment     Demographic Factors:  Male, Adolescent or young adult and Caucasian  Total Time spent with patient: 45 minutes  Psychiatric Specialty Exam:     Blood pressure 112/74, pulse 104, temperature 98.2 F (36.8 C), temperature source Oral, resp. rate 20, height 5' 8.75" (1.746 m), weight 55.339 kg (122 lb), SpO2 100.00%.Body mass index is 18.15 kg/(m^2).  General Appearance: Fairly Groomed  Patent attorney::  Fair  Speech:  Clear and Coherent  Volume:  Normal  Mood:  Euthymic  Affect:  Appropriate  Thought Process:  Coherent and Goal Directed  Orientation:  Full (Time, Place, and Person)  Thought Content:  plans as he moves on, relapse prevention plan  Suicidal Thoughts:  No  Homicidal Thoughts:  No  Memory:  Immediate;   Fair Recent;   Fair Remote;   Fair  Judgement:  Fair  [facility]:  Present  Psychomotor Activity:  Normal  Concentration:  Fair  Recall:  [employer] of Knowledge:NA  Language: Fair  Akathisia:  No  Handed:    AIMS (if indicated):     Assets:  Desire for Improvement Social Support  Sleep:  Number of Hours: 6.75    Musculoskeletal: Strength & Muscle Tone: within normal limits Gait & Station: normal Patient leans: N/A   Mental Status Per Nursing Assessment::   On Admission:     Current Mental Status by Physician: In full contact with reality. There are no active S/S of withdrawal. No active Si plans or intent. He is insightful. He is planning to get his things out of where he was staying but he plans to go with a clean friend to get them knowing that if he was to walk inside the room and they would be using he could be triggered to use. He states he is committed to abstinence. He is going to stay with his sister and plans to be back at work tomorrow. Wants to go to meetings and get reconnected with NA/AA   Loss Factors: Loss of significant relationship  Historical Factors: Family history of suicide  Risk  Reduction Factors:   Sense of responsibility to family, Living with another person, especially a relative and Positive social support  Continued Clinical Symptoms:  Alcohol/Substance Abuse/Dependencies  Cognitive Features That Contribute To Risk:  Closed-mindedness Polarized thinking Thought constriction (tunnel vision)    Suicide Risk:  Minimal: No identifiable suicidal ideation.  Patients presenting with no risk factors but with morbid ruminations; may be classified as minimal risk based on the severity of the depressive symptoms  Discharge Diagnoses:   AXIS I:  Opioid Dependence, Cocaine, Cannabbis Abuse, PTSD, Substance Induced Mood Disorder  AXIS II:  No diagnosis AXIS III:   Past Medical History  Diagnosis Date  . Substance abuse   . Bipolar 1 disorder    AXIS IV:  other psychosocial or environmental problems AXIS V:  61-70 mild symptoms  Plan Of Care/Follow-up recommendations:  Activity:  as tolerated Diet:  regular Follow up with Psychotherapy/AA/NA Is patient on multiple antipsychotic therapies at discharge:  No   Has Patient had three or more failed trials of antipsychotic monotherapy by history:  No  Recommended Plan for Multiple Antipsychotic Therapies: NA    Solange Emry A 05/25/2014, 12:07 PM

## 2014-05-25 NOTE — Progress Notes (Signed)
Patient ID: Spencer Rogers, male   DOB: 1996/03/11, 18 y.o.   MRN: [id number] Patient is discharged ambulatory to drive himself home in his own truck which is in the [facility] lot.  He denies HI/SI.  He verbalizes understanding of his discharge meds and followup.  He was given scripts and a supply by MD.  He is planning to live with his sister and is hopeful that he can be successful not using.

## 2014-05-25 NOTE — Discharge Summary (Signed)
Physician Discharge Summary Note  Patient:  Spencer Rogers is an 18 y.o., male MRN:  [id number] DOB:  16-Nov-1995 Patient phone:  [phone] (home)  Patient address:   [address]  [address] [place] [address],  Total Time spent with patient: Greater than 30 minutes  Date of Admission:  05/22/2014 Date of Discharge: 05/25/14  Reason for Admission: Opioid detox  Discharge Diagnoses: Active Problems:   Severe opioid use disorder   Psychiatric Specialty Exam: Physical Exam  Psychiatric: His speech is normal and behavior is normal. Judgment and thought content normal. His mood appears not anxious. His affect is not angry, not blunt, not labile and not inappropriate. Cognition and memory are normal. He does not exhibit a depressed mood.    Review of Systems  Constitutional: Negative.   HENT: Negative.   Eyes: Negative.   Respiratory: Negative.   Cardiovascular: Negative.   Gastrointestinal: Negative.   Genitourinary: Negative.   Musculoskeletal: Negative.   Skin: Negative.   Neurological: Negative.   Endo/Heme/Allergies: Negative.   Psychiatric/Behavioral: Positive for substance abuse (Opioid dependence). Negative for depression, suicidal ideas, hallucinations and memory loss. The patient has insomnia (Stable). The patient is not nervous/anxious.     Blood pressure 112/74, pulse 104, temperature 98.2 F (36.8 C), temperature source Oral, resp. rate 20, height 5' 8.75" (1.746 m), weight 55.339 kg (122 lb), SpO2 100.00%.Body mass index is 18.15 kg/(m^2).   Past Psychiatric History: Diagnosis: Opioid dependence,  Hospitalizations: [facility] adult unit  Outpatient Care: [facility]  Substance Abuse Care: [facility]  Self-Mutilation: NA  Suicidal Attempts: NA  Violent Behaviors: NA   Musculoskeletal: Strength & Muscle Tone: within normal limits Gait & Station: normal Patient leans: N/A  DSM5: Schizophrenia Disorders:   NA Obsessive-Compulsive Disorders:  NA Trauma-Stressor Disorders:  Posttraumatic Stress Disorder (309.81) Substance/Addictive Disorders:  Opioid Disorder - Severe (304.00) Depressive Disorders:  NA  Axis Diagnosis:  AXIS I:  Opioid dependence AXIS II:  Deferred AXIS III:   Past Medical History  Diagnosis Date  . Substance abuse   . Bipolar 1 disorder    AXIS IV:  other psychosocial or environmental problems and Polysubstance dependence AXIS V:  63  Level of Care:  OP  Hospital Course:  [name] is an 18 y.o. male that presented as a walk-in to [facility] via family friends. Pt presenting with SI and requesting detox from heroin. Pt reported he began using heroin after he walked in on his father that committed suicide by shooting himself in the head. Pt is the one that found him in his home. Pt stated he has been sharing $70 between 3 people of IV heroin daily, last use was 30 cc (used 7 x that day) per pt.  [name] was admitted to the hospital for opioid detoxification treatments. His UDS test reports upon admission was positive for multiple substances. He received clonidine detoxification treatment protocols. He was also enrolled and participated in the group counseling sessions and AA/NA meetings being offered and held on this unit. He learned coping skills. He was also medicated and discharged on Hydroxyzine 25 mg three times daily as needed for anxiety and Trazodone 50 mg Q bedtime for sleep. He presented no other medical issues that required treatment and or monitoring. He tolerated his treatment regimen without any adverse effects and or reactions.  [name] has completed detox treatment and his mood is stable. This is evidenced by his reports of improved  mood and absence of substance withdrawal symptoms. He is currently being discharged to continue substance abuse treatment, medication management/counseling services and routine psychiatric care at the [facility], here in  [place], [place]. He is provided with all the necessary information required to make this appointment without problems.   Upon discharge, he adamantly denies any SIHI, AVH, delusional thoughts, paranoia and or withdrawal symptoms. He received from the [facility], a 4 days worth, supply samples of his [facility] discharge medications. He left [facility] with all belongings in no apparent distress. Transportation per self.  Consults:  psychiatry  Significant Diagnostic Studies:  labs: CBC with diff, CMP, UDS, toxicology tests, U/A  Discharge Vitals:   Blood pressure 112/74, pulse 104, temperature 98.2 F (36.8 C), temperature source Oral, resp. rate 20, height 5' 8.75" (1.746 m), weight 55.339 kg (122 lb), SpO2 100.00%. Body mass index is 18.15 kg/(m^2). Lab Results:   Results for orders placed during the hospital encounter of 05/22/14 (from the past 72 hour(s))  ACETAMINOPHEN LEVEL     Status: None   Collection Time    05/22/14  3:08 PM      Result Value Ref Range   Acetaminophen (Tylenol), Serum <15.0  10 - 30 ug/mL   Comment:            THERAPEUTIC CONCENTRATIONS VARY     SIGNIFICANTLY. A RANGE OF 10-30     ug/mL MAY BE AN EFFECTIVE     CONCENTRATION FOR MANY PATIENTS.     HOWEVER, SOME ARE BEST TREATED     AT CONCENTRATIONS OUTSIDE THIS     RANGE.     ACETAMINOPHEN CONCENTRATIONS     >150 ug/mL AT 4 HOURS AFTER     INGESTION AND >50 ug/mL AT 12     HOURS AFTER INGESTION ARE     OFTEN ASSOCIATED WITH TOXIC     REACTIONS.  CBC     Status: Abnormal   Collection Time    05/22/14  3:08 PM      Result Value Ref Range   WBC 3.7 (*) 4.0 - 10.5 K/uL   RBC 4.38  4.22 - 5.81 MIL/uL   Hemoglobin 13.7  13.0 - 17.0 g/dL   HCT 40.4  39.0 - 52.0 %   MCV 92.2  78.0 - 100.0 fL   MCH 31.3  26.0 - 34.0 pg   MCHC 33.9  30.0 - 36.0 g/dL   RDW 11.1 (*) 11.5 - 15.5 %   Platelets 129 (*) 150 - 400 K/uL  COMPREHENSIVE METABOLIC PANEL     Status: Abnormal   Collection Time    05/22/14  3:08 PM      Result  Value Ref Range   Sodium 138  137 - 147 mEq/L   Potassium 4.0  3.7 - 5.3 mEq/L   Chloride 102  96 - 112 mEq/L   CO2 22  19 - 32 mEq/L   Glucose, Bld 122 (*) 70 - 99 mg/dL   BUN 13  6 - 23 mg/dL   Creatinine, Ser 1.20  0.50 - 1.35 mg/dL   Calcium 9.6  8.4 - 10.5 mg/dL   Total Protein 6.7  6.0 - 8.3 g/dL   Albumin 4.0  3.5 - 5.2 g/dL   AST 26  0 - 37 U/L   ALT 15  0 - 53 U/L   Alkaline Phosphatase 53  39 - 117 U/L   Total Bilirubin 0.4  0.3 - 1.2 mg/dL   GFR  calc non Af Amer 86 (*) >90 mL/min   GFR calc Af Amer >90  >90 mL/min   Comment: (NOTE)     The eGFR has been calculated using the CKD EPI equation.     This calculation has not been validated in all clinical situations.     eGFR's persistently <90 mL/min signify possible Chronic Kidney     Disease.   Anion gap 14  5 - 15  ETHANOL     Status: None   Collection Time    05/22/14  3:08 PM      Result Value Ref Range   Alcohol, Ethyl (B) <11  0 - 11 mg/dL   Comment:            LOWEST DETECTABLE LIMIT FOR     SERUM ALCOHOL IS 11 mg/dL     FOR MEDICAL PURPOSES ONLY  SALICYLATE LEVEL     Status: Abnormal   Collection Time    05/22/14  3:08 PM      Result Value Ref Range   Salicylate Lvl <2.0 (*) 2.8 - 20.0 mg/dL  URINE RAPID DRUG SCREEN (HOSP PERFORMED)     Status: Abnormal   Collection Time    05/22/14  3:14 PM      Result Value Ref Range   Opiates POSITIVE (*) NONE DETECTED   Cocaine NONE DETECTED  NONE DETECTED   Benzodiazepines POSITIVE (*) NONE DETECTED   Amphetamines POSITIVE (*) NONE DETECTED   Tetrahydrocannabinol POSITIVE (*) NONE DETECTED   Barbiturates NONE DETECTED  NONE DETECTED   Comment:            DRUG SCREEN FOR MEDICAL PURPOSES     ONLY.  IF CONFIRMATION IS NEEDED     FOR ANY PURPOSE, NOTIFY LAB     WITHIN 5 DAYS.                LOWEST DETECTABLE LIMITS     FOR URINE DRUG SCREEN     Drug Class       Cutoff (ng/mL)     Amphetamine      1000     Barbiturate      200     Benzodiazepine   200      Tricyclics       300     Opiates          300     Cocaine          300     THC              50    Physical Findings: AIMS: Facial and Oral Movements Muscles of Facial Expression: None, normal Lips and Perioral Area: None, normal Jaw: None, normal Tongue: None, normal,Extremity Movements Upper (arms, wrists, hands, fingers): None, normal Lower (legs, knees, ankles, toes): None, normal, Trunk Movements Neck, shoulders, hips: None, normal, Overall Severity Severity of abnormal movements (highest score from questions above): None, normal Incapacitation due to abnormal movements: None, normal Patient's awareness of abnormal movements (rate only patient's report): No Awareness, Dental Status Current problems with teeth and/or dentures?: No Does patient usually wear dentures?: No  CIWA:  CIWA-Ar Total: 0 COWS:  COWS Total Score: 2  Psychiatric Specialty Exam: See Psychiatric Specialty Exam and Suicide Risk Assessment completed by Attending Physician prior to discharge.  Discharge destination:  Home  Is patient on multiple antipsychotic therapies at discharge:  No   Has Patient had three or more failed trials of antipsychotic monotherapy by  history:  No  Recommended Plan for Multiple Antipsychotic Therapies: NA    Medication List    STOP taking these medications       divalproex 500 MG DR tablet  Commonly known as:  DEPAKOTE     lamoTRIgine 25 MG tablet  Commonly known as:  LAMICTAL      TAKE these medications     Indication   hydrOXYzine 25 MG tablet  Commonly known as:  ATARAX/VISTARIL  Take 1 tablet (25 mg) three times daily as needed: For anxiety   Indication:  Tension, Anxiety     traZODone 50 MG tablet  Commonly known as:  DESYREL  Take 1 tablet (50 mg total) by mouth at bedtime as needed for sleep.   Indication:  Trouble Sleeping       Follow-up Information   Follow up with [facility] On 05/26/2014. (Please go to [facility] walk in clinic on  Wednesday, May 26, 2014 or any weekday between [place] - 12Noon or 1PM - 3PM for medication management and counseling.)    Contact information:   [address] [place], [place]   [address]  [phone]     Follow-up recommendations: Activity:  As tolerated Diet: As recommended by your primary care doctor. Keep all scheduled follow-up appointments as recommended.    Comments:  Take all your medications as prescribed by your mental healthcare provider. Report any adverse effects and or reactions from your medicines to your outpatient provider promptly. Patient is instructed and cautioned to not engage in alcohol and or illegal drug use while on prescription medicines. In the event of worsening symptoms, patient is instructed to call the crisis hotline, 911 and or go to the nearest ED for appropriate evaluation and treatment of symptoms. Follow-up with your primary care provider for your other medical issues, concerns and or health care needs.   Total Discharge Time:  Greater than 30 minutes.  Signed: Encarnacion Slates, PMHNP-BC 05/25/2014, 10:17 AM I personally assessed the patient and formulated the plan Geralyn Flash A. Sabra Heck, M.D.

## 2014-05-25 NOTE — Tx Team (Signed)
Interdisciplinary Treatment Plan Update   Date Reviewed:  05/25/2014  Time Reviewed:  8:51 AM  Progress in Treatment:   Attending groups: Yes Participating in groups: Yes Taking medication as prescribed: Yes  Tolerating medication: Yes Family/Significant other contact made: Yes, collateral contact with sister Patient understands diagnosis: Yes  Discussing patient identified problems/goals with staff: Yes Medical problems stabilized or resolved: Yes Denies suicidal/homicidal ideation: Yes Patient has not harmed self or others: Yes  For review of initial/current patient goals, please see plan of care.  Estimated Length of Stay:  Discharge today  Reasons for Continued Hospitalization:   New Problems/Goals identified:    Discharge Plan or Barriers:   Home with [facility]  Additional Comments:      Attendees:  Patient:   Spencer Rogers 05/25/2014 8:51 AM   Signature: Geoffery Lyons, MD 05/25/2014 8:51 AM  Signature: Serena Colonel 05/25/2014 8:51 AM  Signature:  Roswell Miners, RN 05/25/2014 8:51 AM  Signature:  Quintella Reichert, RN 05/25/2014 8:51 AM  Signature:  Neill Loft RN 05/25/2014 8:51 AM  Signature:  Juline Patch, LCSW 05/25/2014 8:51 AM  Signature:  Belenda Cruise Drinkard, LCSW-A 05/25/2014 8:51 AM  Signature:  Leisa Lenz, Care Coordinator [facility] 05/25/2014 8:51 AM  Signature:  05/25/2014 8:51 AM  Signature:  05/25/2014  8:51 AM  Signature:   Onnie Boer, RN [facility] 05/25/2014  8:51 AM  Signature: 05/25/2014  8:51 AM    Scribe for Treatment Team:   Juline Patch,  05/25/2014 8:51 AM

## 2014-05-25 NOTE — Progress Notes (Signed)
[facility] Adult Case Management Discharge Plan :  Will you be returning to the same living situation after discharge: Yes, patient is returning to sister's home. At discharge, do you have transportation home?:Yes,  Patient reports he has his vehicle in the parking lot. Do you have the ability to pay for your medications: Yes, patient is able to obtain medications.  Release of information consent forms completed and in the chart;  Patient's signature needed at discharge.  Patient to Follow up at: Follow-up Information   Follow up with [facility] On 05/26/2014. (Please go to [facility] walk in clinic on Wednesday, May 26, 2014 or any weekday between 8AM - 12Noon or 1PM - 3PM for medication management and counseling.)    Contact information:   [address] [place], [place]   [address]  [phone]      Patient denies SI/HI: Patient no longer endorsing SI/HI or other thoughts of self harm.   Safety Planning and Suicide Prevention discussed: .Reviewed with all patients during discharge planning group   Spencer Rogers, Joesph July 05/25/2014, 10:32 AM

## 2014-05-25 NOTE — Progress Notes (Signed)
The focus of this group is to educate the patient on the purpose and policies of crisis stabilization and provide a format to answer questions about their admission.  The group details unit policies and expectations of patients while admitted. Patient was an active participant in group.  He did group exercises, he talked about his goal for today and talked about being discharged.  He made a list of things to do when he feels triggered to use drugs and talked about reading over this list and thinking about using them.

## 2014-05-28 NOTE — Progress Notes (Signed)
Patient Discharge Instructions:  After Visit Summary (AVS):   Faxed to:  05/28/14 Discharge Summary Note:   Faxed to:  05/28/14 Psychiatric Admission Assessment Note:   Faxed to:  05/28/14 Suicide Risk Assessment - Discharge Assessment:   Faxed to:  05/28/14 Faxed/Sent to the Next Level Care provider:  05/28/14 Faxed to [facility] @ [phone]  Jerelene Redden, 05/28/2014, 3:29 PM

## 2015-03-06 ENCOUNTER — Encounter (HOSPITAL_COMMUNITY): Payer: Self-pay | Admitting: Emergency Medicine

## 2015-03-06 ENCOUNTER — Emergency Department (HOSPITAL_COMMUNITY): Payer: Self-pay

## 2015-03-06 ENCOUNTER — Emergency Department (HOSPITAL_COMMUNITY)
Admission: EM | Admit: 2015-03-06 | Discharge: 2015-03-06 | Disposition: A | Discharge status: Home / Self Care | Payer: Self-pay | Attending: Emergency Medicine | Admitting: Emergency Medicine

## 2015-03-06 DIAGNOSIS — Z8659 Personal history of other mental and behavioral disorders: Secondary | ICD-10-CM | POA: Insufficient documentation

## 2015-03-06 DIAGNOSIS — R109 Unspecified abdominal pain: Secondary | ICD-10-CM

## 2015-03-06 DIAGNOSIS — Z79899 Other long term (current) drug therapy: Secondary | ICD-10-CM | POA: Insufficient documentation

## 2015-03-06 DIAGNOSIS — R1084 Generalized abdominal pain: Secondary | ICD-10-CM | POA: Insufficient documentation

## 2015-03-06 DIAGNOSIS — Z720 Tobacco use: Secondary | ICD-10-CM | POA: Insufficient documentation

## 2015-03-06 DIAGNOSIS — R112 Nausea with vomiting, unspecified: Secondary | ICD-10-CM | POA: Insufficient documentation

## 2015-03-06 HISTORY — DX: Other psychoactive substance abuse, uncomplicated: F19.10

## 2015-03-06 HISTORY — DX: Other specified personal risk factors, not elsewhere classified: Z91.89

## 2015-03-06 HISTORY — DX: Post-traumatic stress disorder, unspecified: F43.10

## 2015-03-06 HISTORY — DX: Other psychoactive substance use, unspecified with psychoactive substance-induced mood disorder: F19.94

## 2015-03-06 LAB — CBC WITH DIFFERENTIAL/PLATELET
BASOS ABS: 0 10*3/uL (ref 0.0–0.1)
Basophils Relative: 0 % (ref 0–1)
EOS ABS: 0 10*3/uL (ref 0.0–0.7)
Eosinophils Relative: 0 % (ref 0–5)
HCT: 50.9 % (ref 39.0–52.0)
Hemoglobin: 18 g/dL — ABNORMAL HIGH (ref 13.0–17.0)
LYMPHS ABS: 1.4 10*3/uL (ref 0.7–4.0)
Lymphocytes Relative: 6 % — ABNORMAL LOW (ref 12–46)
MCH: 32.4 pg (ref 26.0–34.0)
MCHC: 35.4 g/dL (ref 30.0–36.0)
MCV: 91.5 fL (ref 78.0–100.0)
MONO ABS: 1.2 10*3/uL — AB (ref 0.1–1.0)
Monocytes Relative: 5 % (ref 3–12)
NEUTROS ABS: 21.2 10*3/uL — AB (ref 1.7–7.7)
Neutrophils Relative %: 89 % — ABNORMAL HIGH (ref 43–77)
PLATELETS: 259 10*3/uL (ref 150–400)
RBC: 5.56 MIL/uL (ref 4.22–5.81)
RDW: 12.4 % (ref 11.5–15.5)
WBC: 23.8 10*3/uL — ABNORMAL HIGH (ref 4.0–10.5)

## 2015-03-06 LAB — COMPREHENSIVE METABOLIC PANEL
ALT: 35 U/L (ref 17–63)
ANION GAP: 17 — AB (ref 5–15)
AST: 34 U/L (ref 15–41)
Albumin: 5.6 g/dL — ABNORMAL HIGH (ref 3.5–5.0)
Alkaline Phosphatase: 76 U/L (ref 38–126)
BILIRUBIN TOTAL: 1.2 mg/dL (ref 0.3–1.2)
BUN: 28 mg/dL — AB (ref 6–20)
CALCIUM: 10.3 mg/dL (ref 8.9–10.3)
CO2: 29 mmol/L (ref 22–32)
Chloride: 99 mmol/L — ABNORMAL LOW (ref 101–111)
Creatinine, Ser: 1.31 mg/dL — ABNORMAL HIGH (ref 0.61–1.24)
GFR calc non Af Amer: >60 mL/min (ref >60)
Glucose, Bld: 125 mg/dL — ABNORMAL HIGH (ref 65–99)
Potassium: 3.3 mmol/L — ABNORMAL LOW (ref 3.5–5.1)
Sodium: 145 mmol/L (ref 135–145)
Total Protein: 8.4 g/dL — ABNORMAL HIGH (ref 6.5–8.1)

## 2015-03-06 LAB — URINE MICROSCOPIC-ADD ON

## 2015-03-06 LAB — URINALYSIS, ROUTINE W REFLEX MICROSCOPIC
BILIRUBIN URINE: NEGATIVE
Glucose, UA: NEGATIVE mg/dL
Hgb urine dipstick: NEGATIVE
Ketones, ur: 15 mg/dL — AB
LEUKOCYTES UA: NEGATIVE
Nitrite: NEGATIVE
PROTEIN: 100 mg/dL — AB
Specific Gravity, Urine: >1.046 — ABNORMAL HIGH (ref 1.005–1.030)
UROBILINOGEN UA: 1 mg/dL (ref 0.0–1.0)
pH: 7 (ref 5.0–8.0)

## 2015-03-06 LAB — LIPASE, BLOOD: Lipase: 22 U/L (ref 22–51)

## 2015-03-06 MED ORDER — IOHEXOL 300 MG/ML  SOLN
100.0000 mL | Freq: Once | INTRAMUSCULAR | Status: AC | PRN
  Administered 2015-03-06: 100 mL via INTRAVENOUS

## 2015-03-06 MED ORDER — ONDANSETRON HCL 4 MG/2ML IJ SOLN
4.0000 mg | Freq: Once | INTRAMUSCULAR | Status: AC
  Administered 2015-03-06: 4 mg via INTRAVENOUS
  Filled 2015-03-06: qty 2

## 2015-03-06 MED ORDER — DICYCLOMINE HCL 20 MG PO TABS: 20.0000 mg | ORAL_TABLET | Freq: Four times a day (QID) | ORAL | Status: DC | PRN

## 2015-03-06 MED ORDER — IOHEXOL 300 MG/ML  SOLN
50.0000 mL | Freq: Once | INTRAMUSCULAR | Status: AC | PRN
  Administered 2015-03-06: 50 mL via ORAL

## 2015-03-06 MED ORDER — SODIUM CHLORIDE 0.9 % IV BOLUS (SEPSIS)
1000.0000 mL | Freq: Once | INTRAVENOUS | Status: AC
  Administered 2015-03-06: 1000 mL via INTRAVENOUS

## 2015-03-06 MED ORDER — SODIUM CHLORIDE 0.9 % IV SOLN
INTRAVENOUS | Status: DC
  Administered 2015-03-06: 12:00:00 via INTRAVENOUS

## 2015-03-06 MED ORDER — ONDANSETRON HCL 4 MG/2ML IJ SOLN: 4.0000 mg | INTRAMUSCULAR | Status: DC | PRN

## 2015-03-06 MED ORDER — ONDANSETRON HCL 4 MG PO TABS: 4.0000 mg | ORAL_TABLET | Freq: Three times a day (TID) | ORAL | Status: DC | PRN

## 2015-03-06 MED ORDER — DICYCLOMINE HCL 10 MG/ML IM SOLN
20.0000 mg | Freq: Once | INTRAMUSCULAR | Status: AC
  Administered 2015-03-06: 20 mg via INTRAMUSCULAR
  Filled 2015-03-06: qty 2

## 2015-03-06 NOTE — Discharge Instructions (Signed)
°Emergency Department Resource Guide °1) Find a Doctor and Pay Out of Pocket °Although you won't have to find out who is covered by your insurance plan, it is a good idea to ask around and get recommendations. You will then need to call the office and see if the doctor you have chosen will accept you as a new patient and what types of options they offer for patients who are self-pay. Some doctors offer discounts or will set up payment plans for their patients who do not have insurance, but you will need to ask so you aren't surprised when you get to your appointment. ° °2) Contact Your Local Health Department °Not all health departments have doctors that can see patients for sick visits, but many do, so it is worth a call to see if yours does. If you don't know where your local health department is, you can check in your phone book. The CDC also has a tool to help you locate your state's health department, and many state websites also have listings of all of their local health departments. ° °3) Find a Walk-in Clinic °If your illness is not likely to be very severe or complicated, you may want to try a walk in clinic. These are popping up all over the country in pharmacies, drugstores, and shopping centers. They're usually staffed by nurse practitioners or physician assistants that have been trained to treat common illnesses and complaints. They're usually fairly quick and inexpensive. However, if you have serious medical issues or chronic medical problems, these are probably not your best option. ° °No Primary Care Doctor: °- Call [facility] at  832-8000 - they can help you locate a primary care doctor that  accepts your insurance, provides certain services, etc. °- Physician Referral Service- 1-800-533-3463 ° °Chronic Pain Problems: °Organization         Address  Phone   Notes  °[facility]  (336) 297-2271 Patients need to be referred by their primary care doctor.  ° °Medication  Assistance: °Organization         Address  Phone   Notes  °[place] [facility] [address] °[place], [place] [address] (336) 641-8030 --Must be a resident of [place] °-- Must have NO insurance coverage whatsoever (no Medicaid/ Medicare, etc.) °-- The pt. MUST have a primary care doctor that directs their care regularly and follows them in the community °  °[facility]  (866) 331-1348   °[employer]  (888) 892-1162   ° °Agencies that provide inexpensive medical care: °Organization         Address  Phone   Notes  °[facility]  (336) 832-8035   °[facility]    (336) 832-7272   °[facility] [address] °[place], [place] [address] (336) 832-4777   °[facility] [address], °[place] (336) 271-4999   °[facility]    (336) 373-0678   °[facility]    (336) 272-1050   °[facility] ° [address], [place] Phone:  (336) 832-4444, Fax:  (336) 832-4440 Hours of Operation:  9 am - 6 pm, M-F.  Also accepts Medicaid/Medicare and self-pay.  °[facility] ° [address], [place] Phone: (336) 832-3150, Fax: (336) 832-3151. Hours of Operation:  8:30 am - 5:30 pm, M-F.  Also accepts Medicaid and self-pay.  °[facility] [place] [address], [place] Phone: (336) 878-6027   °[facility] [address], [place], [place] (336)723-1848, Ext. 123 Mondays & Thursdays: 7-9 AM.  First 15 patients are seen on a first come, first serve basis. °  ° °Medicaid-accepting [place] Providers: ° °Organization         Address  Phone   Notes  °[facility] [address], [place] (336) 641-2100 Also accepts self-pay patients.  °[facility] [address], [place] ° (336) 856-9996   °[facility] [address], [place]  (336) 288-8857   °[facility] [address] [place] [address], [place] (336) 299-7000   °Veita Bland [address], [place]  ° (336) 373-1557 Only accepts [place] Access Medicaid patients after they have their name applied to their card.  ° °Self-Pay (no insurance) in [place]: ° °Organization         Address  Phone   Notes  °Sickle Cell Patients, [facility] [address], [place] (336) 832-1970   °[facility] [address], [place] (336) 832-4400   °[facility] ° [address], [place] (336) 992-4800   °[facility]/Dr. Osei-Bonsu ° [address] [place] [address], [place] or [address], [place] (336) 841-8500 Phone number for both [place] and [place] locations is the same.  °[facility] [address], [place] (336) 299-0000   °[facility] [address] [place] [address], [place] or [address] (336) 852-7530 °(336) 878-2260   °[facility] [address], [place] (336) 350-1642, phone; (336) 294-5005, fax Sees patients 1st and 3rd Saturday of every month.  Must not qualify for public or private insurance (i.e. Medicaid, Medicare, [place] Health Choice, Veterans' Benefits) • Household income should be no more than 200% of the poverty level •The clinic cannot treat you if you are pregnant or think you are pregnant • Sexually transmitted diseases are not treated at the clinic.  ° ° °Dental Care: °Organization         Address  Phone  Notes  °[place] [facility] [address], [place] (336) 641-6152 Accepts children up to age 21 who are enrolled in Medicaid or [place] Health Choice; pregnant women with a Medicaid card; and children who have applied for Medicaid or [place] Health Choice, but were declined, whose parents can pay a reduced fee at time of service.  °[place]  [facility] [place]  [address], [place] (336) 641-7733 Accepts children up to age 21 who are enrolled in Medicaid or [place] Health Choice; pregnant women with a Medicaid card; and children who have applied for Medicaid or [place] Health Choice, but were declined, whose parents can pay a reduced fee at time of service.  °[facility] ° [address], [place] (336) 641-4533 Patients are seen by appointment only. Walk-ins are not accepted. [facility] will see patients 18 years of age and older. °Monday - Tuesday (8am-5pm) °Most Wednesdays (8:30-5pm) °$30 per visit, cash only  °[facility] ° [address], [place] (336) 641-4533 Patients are seen by appointment only. Walk-ins are not accepted. [facility] will see patients 18 years of age and older. °One   Wednesday Evening (Monthly: Volunteer Based).  $30 per visit, cash only  °[facility]  (919) 537-3737 for adults; Children under age 4, call Graduate Pediatric Dentistry at (919) [phone]. Children aged 4-14, please call (919) [phone] to request a pediatric application. ° Dental services are provided in all areas of dental care including fillings, crowns and bridges, complete and partial dentures, implants, gum treatment, root canals, and extractions. Preventive care is also provided. Treatment is provided to both adults and children. °Patients are selected via a lottery and there is often a waiting list. °  °[facility] [address], °[place] ° (336) 763-8833 [email or web address] °  °[facility] [address], [place], [place] (336)723-1848, Ext. 123 Second and Fourth Thursday of each month, opens at 6:30 AM; Clinic ends at 9 AM.  Patients are seen on a first-come first-served basis, and a limited number are seen during each clinic.  ° °[facility] ° [address], [place], [place] (336) 723-7904    Eligibility Requirements °You must have lived in [place], or [place] counties for at least the last three months. °  You cannot be eligible for state or federal sponsored healthcare insurance, including Veterans Administration, Medicaid, or Medicare. °  You generally cannot be eligible for healthcare insurance through your employer.  °  How to apply: °Eligibility screenings are held every Tuesday and Wednesday afternoon from 1:00 pm until 4:00 pm. You do not need an appointment for the interview!  °[facility] [address], [place], [place] 336-631-2330   °[facility]  336-342-8273   °[facility]  336-703-3100   °[facility]  336-570-6415   ° °Behavioral Health Resources in the Community: °Intensive Outpatient Programs °Organization         Address  Phone  Notes  °[facility] [address], [place], [place] 336-878-6098   °[facility] [address], [place], [place] 336-832-9800   °[facility] [address], [place], [place] ° 336-882-2125   °[facility] [address],  °[place], [place] 1-800-853-5163 or 336-641-4981   °Substance Abuse Resources °Organization         Address  Phone  Notes  °[facility]  336-882-2125   °[facility]  336-784-9470   °[facility]  336-285-9073   °[facility]  336-845-3988   °Residential & Outpatient Substance Abuse Program  1-800-659-3381   °Psychological Services °Organization         Address  Phone  Notes  °[facility]  336- 832-9600   °[facility]  336- 378-7881   °[facility] [address], [place] 1-800-853-5163 or 336-641-4981   ° °Mobile Crisis Teams °Organization         Address  Phone  Notes  °[facility], Mobile Crisis Care Unit  1-877-626-1772   °Assertive °[facility] ° [address]  [place], [place] 336-834-9664   °Sharon DeEsch [address] °[place] 336-554-5454   ° °Self-Help/Support Groups °Organization         Address  Phone             Notes  °[facility]  - variety of support groups  336- 373-1402 Call for more information  °Narcotics Anonymous (NA), [facility] [address], °[place]  2 meetings at this location  ° °  Residential Treatment Programs °Organization         Address  Phone  Notes  °[facility] [address],    °[place]  1-866-801-8205   °[facility] ° [address], [place], [place] 704-293-8524   °[facility] [address], [place] 336-845-3988 Admissions: 8am-3pm M-F  °[facility] [address]    °[place], [place] 336-841-1104   °[facility] [address], [place], [place] 336-379-7146   °[facility] [address]  °[place], [place] 336-285-9073   °[facility] Intensive Outpatient [address], [place], [place] 336-852-3033   °[facility] [address]  °[place], [place] 1-877-615-2722 or 336-784-9470   °[facility] [address] [place], [place] 336-227-7417 Accepts Medicaid  °[facility] [address]  °[place] 1-800-659-3381 Substance Abuse/Addiction Treatment  ° °[place] Behavioral Health Resources °Organization         Address  Phone  Notes  °[facility]  (888) 581-9988   °Julie Brannon, PhD [address] [place], [place]   (336) 349-5553 or (336) 951-0000   °[facility]   [address] °[place], [place] (336) 349-4454   °[facility] [address], [place], [place] (336) 342-8316 Insurance/Medicaid/sponsorship through [facility]  °[facility] [address]                                    [place], [place] (336) 342-8316 Therapy/tele-psych/case    °[facility] [address]  ° [place], [place] (336) 349-2233    °Dr. Arfeen  (336) 349-4544   °[facility] [place]  [employer] [place] [facility] 1) [address],  °2) [address], [place] °3)  [address], [place] (336) 349-3220 °(336) 342-7768 ° °(336) 342-8140   °[place] [facility] (336) 342-1394 or (336) 342-3537 (After Hours)    ° ° °Take the prescriptions as directed.  Increase your fluid intake (ie:  Gatoraide) for the next few days, as discussed.  Eat a bland diet and advance to your regular diet slowly as you can tolerate it.  Call your regular medical doctor tomorrow to schedule a follow up appointment this week.  Return to the Emergency Department immediately if not improving (or even worsening) despite taking the medicines as prescribed, any black or bloody stool or vomit, if you develop a fever over "101," or for any other concerns. ° °

## 2015-03-06 NOTE — ED Notes (Signed)
He states he has had numerous episodes of n/v x 3 days.  He denies diarrhea, stating he last had a normal b.m. Some 4 days ago.  He is pale and in no distress.

## 2015-03-06 NOTE — ED Provider Notes (Signed)
CSN: [id number]     Arrival date & time 03/06/15  0809 History   First MD Initiated Contact with Patient 03/06/15 [phone]     Chief Complaint  Patient presents with  . Vomiting      HPI Pt was seen at 0920. Per pt, c/o gradual onset and persistence of multiple intermittent episodes of N/V that began 3 days ago.   Has been associated with generalized "cramping" abd pain. Denies diarrhea, no CP/SOB, no back pain, no fevers, no black or blood in stools or emesis.     Past Medical History  Diagnosis Date  . Bipolar 1 disorder   . PTSD (post-traumatic stress disorder)   . Polysubstance abuse     opiates, benzos, cocaine, marijuana  . Substance induced mood disorder   . History of drug overdose    Past Surgical History  Procedure Laterality Date  . Unknown      History  Substance Use Topics  . Smoking status: Current Every Day Smoker -- 1.00 packs/day for 10 years    Types: Cigarettes  . Smokeless tobacco: Never Used  . Alcohol Use: Yes     Comment: occasionally     Review of Systems ROS: Statement: All systems negative except as marked or noted in the HPI; Constitutional: Negative for fever and chills. ; ; Eyes: Negative for eye pain, redness and discharge. ; ; ENMT: Negative for ear pain, hoarseness, nasal congestion, sinus pressure and sore throat. ; ; Cardiovascular: Negative for chest pain, palpitations, diaphoresis, dyspnea and peripheral edema. ; ; Respiratory: Negative for cough, wheezing and stridor. ; ; Gastrointestinal: +N/V, abd pain. Negative for diarrhea, blood in stool, hematemesis, jaundice and rectal bleeding. . ; ; Genitourinary: Negative for dysuria, flank pain and hematuria. ; ; Musculoskeletal: Negative for back pain and neck pain. Negative for swelling and trauma.; ; Skin: Negative for pruritus, rash, abrasions, blisters, bruising and skin lesion.; ; Neuro: Negative for headache, lightheadedness and neck stiffness. Negative for weakness, altered level of  consciousness , altered mental status, extremity weakness, paresthesias, involuntary movement, seizure and syncope.     Allergies  Review of patient's allergies indicates no known allergies.  Home Medications   Prior to Admission medications   Medication Sig Start Date End Date Taking? Authorizing Provider  OVER THE COUNTER MEDICATION Kratom herb from local smoke shop to help with opiod abuse  Takes 12 capsules in the morning and 2-3 capsules every 15 minutes throughout the day   Yes Historical Provider, MD  hydrOXYzine (ATARAX/VISTARIL) 25 MG tablet Take 1 tablet (25 mg) three times daily as needed: For anxiety Patient not taking: Reported on 03/06/2015 05/25/14   Sanjuana Kava, NP  traZODone (DESYREL) 50 MG tablet Take 1 tablet (50 mg total) by mouth at bedtime as needed for sleep. Patient not taking: Reported on 03/06/2015 05/25/14   Sanjuana Kava, NP   BP 110/71 mmHg  Pulse 61  Temp(Src) 98 F (36.7 C) (Oral)  Resp 17  SpO2 100% Physical Exam  0925: Physical examination:  Nursing notes reviewed; Vital signs and O2 SAT reviewed;  Constitutional: Well developed, Well nourished, Uncomfortable appearing.; Head:  Normocephalic, atraumatic; Eyes: EOMI, PERRL, No scleral icterus; ENMT: Mouth and pharynx normal, Mucous membranes dry; Neck: Supple, Full range of motion, No lymphadenopathy; Cardiovascular: Regular rate and rhythm, No murmur, rub, or gallop; Respiratory: Breath sounds clear & equal bilaterally, No rales, rhonchi, wheezes.  Speaking full sentences with ease, Normal respiratory effort/excursion; Chest: Nontender, Movement normal; Abdomen: Soft, +  diffuse tenderness to palp. No rebound or guarding. Nondistended, Normal bowel sounds; Genitourinary: No CVA tenderness; Extremities: Pulses normal, No tenderness, No edema, No calf edema or asymmetry.; Neuro: AA&Ox3, Major CN grossly intact.  Speech clear. No gross focal motor or sensory deficits in extremities.; Skin: Color normal, Warm,  Dry.   ED Course  Procedures   [phone] [id number]:  Pt states he has been clean for several months now. Requesting no narcotics. Will tx symptomatically as workup progresses.   1340:  WBC count elevated: No UTI on Udip and CT A/P reassuring. IVF given for dehydration. Pt remains afebrile. Pt has tol PO well while in the ED without N/V.  No stooling while in the ED.  Abd benign, VSS. Feels better and wants to go home now. Dx and testing d/w pt and family.  Questions answered.  Verb understanding, agreeable to d/c home with outpt f/u.     MDM  MDM Reviewed: previous chart, nursing note and vitals Reviewed previous: labs Interpretation: CT scan and labs   Results for orders placed or performed during the hospital encounter of 03/06/15  CBC with Differential  Result Value Ref Range   WBC 23.8 (H) 4.0 - 10.5 K/uL   RBC 5.56 4.22 - 5.81 MIL/uL   Hemoglobin 18.0 (H) 13.0 - 17.0 g/dL   HCT 56.250.9 13.039.0 - 86.552.0 %   MCV 91.5 78.0 - 100.0 fL   MCH 32.4 26.0 - 34.0 pg   MCHC 35.4 30.0 - 36.0 g/dL   RDW 78.412.4 69.611.5 - 29.515.5 %   Platelets 259 150 - 400 K/uL   Neutrophils Relative % 89 (H) 43 - 77 %   Neutro Abs 21.2 (H) 1.7 - 7.7 K/uL   Lymphocytes Relative 6 (L) 12 - 46 %   Lymphs Abs 1.4 0.7 - 4.0 K/uL   Monocytes Relative 5 3 - 12 %   Monocytes Absolute 1.2 (H) 0.1 - 1.0 K/uL   Eosinophils Relative 0 0 - 5 %   Eosinophils Absolute 0.0 0.0 - 0.7 K/uL   Basophils Relative 0 0 - 1 %   Basophils Absolute 0.0 0.0 - 0.1 K/uL  Comprehensive metabolic panel  Result Value Ref Range   Sodium 145 135 - 145 mmol/L   Potassium 3.3 (L) 3.5 - 5.1 mmol/L   Chloride 99 (L) 101 - 111 mmol/L   CO2 29 22 - 32 mmol/L   Glucose, Bld 125 (H) 65 - 99 mg/dL   BUN 28 (H) 6 - 20 mg/dL   Creatinine, Ser 2.841.31 (H) 0.61 - 1.24 mg/dL   Calcium 13.210.3 8.9 - 44.010.3 mg/dL   Total Protein 8.4 (H) 6.5 - 8.1 g/dL   Albumin 5.6 (H) 3.5 - 5.0 g/dL   AST 34 15 - 41 U/L   ALT 35 17 - 63 U/L   Alkaline Phosphatase 76 38 - 126 U/L   Total  Bilirubin 1.2 0.3 - 1.2 mg/dL   GFR calc non Af Amer >60 >60 mL/min   GFR calc Af Amer >60 >60 mL/min   Anion gap 17 (H) 5 - 15  Lipase, blood  Result Value Ref Range   Lipase 22 22 - 51 U/L  Urinalysis, Routine w reflex microscopic  Result Value Ref Range   Color, Urine YELLOW YELLOW   APPearance CLEAR CLEAR   Specific Gravity, Urine >1.046 (H) 1.005 - 1.030   pH 7.0 5.0 - 8.0   Glucose, UA NEGATIVE NEGATIVE mg/dL   Hgb urine dipstick NEGATIVE NEGATIVE  Bilirubin Urine NEGATIVE NEGATIVE   Ketones, ur 15 (A) NEGATIVE mg/dL   Protein, ur 409 (A) NEGATIVE mg/dL   Urobilinogen, UA 1.0 0.0 - 1.0 mg/dL   Nitrite NEGATIVE NEGATIVE   Leukocytes, UA NEGATIVE NEGATIVE  Urine microscopic-add on  Result Value Ref Range   Squamous Epithelial / LPF RARE RARE   Urine-Other MUCOUS PRESENT    Dg Chest 2 View 03/06/2015   CLINICAL DATA:  Nausea and vomiting for 3 days. Mid to lower back pain and chest pain. Smoker.  EXAM: CHEST  2 VIEW  COMPARISON:  None.  FINDINGS: Normal mediastinum and cardiac silhouette. Normal pulmonary vasculature. No evidence of effusion, infiltrate, or pneumothorax. No acute bony abnormality.  IMPRESSION: No acute cardiopulmonary process.   Electronically Signed   By: Genevive Bi M.D.   On: 03/06/2015 10:48   Ct Abdomen Pelvis W Contrast 03/06/2015   CLINICAL DATA:  Nausea and vomiting for 3 days.  EXAM: CT ABDOMEN AND PELVIS WITH CONTRAST  TECHNIQUE: Multidetector CT imaging of the abdomen and pelvis was performed using the standard protocol following bolus administration of intravenous contrast.  CONTRAST:  OMNIPAQUE IOHEXOL 300 MG/ML SOLN, 50mL OMNIPAQUE IOHEXOL 300 MG/ML SOLN  COMPARISON:  None.  FINDINGS: Lower chest: Lung bases are clear.  Hepatobiliary: No focal hepatic lesion. No biliary duct dilatation. Gallbladder is normal. Common bile duct is normal.  Pancreas: Pancreas is normal. No ductal dilatation. No pancreatic inflammation.  Spleen: Normal spleen   Adrenals/urinary tract: Adrenals normal. Kidneys enhance symmetrically. No hydronephrosis. Ureters and bladder normal.  Stomach/Bowel: New stomach small bowel cecum normal. Appendix is normal. Normal volume stool throughout the colon. The rectum appears normal.  Vascular/Lymphatic: Abdominal aorta is normal caliber. There is no retroperitoneal or periportal lymphadenopathy. No pelvic lymphadenopathy.  Reproductive: Prostate is normal.  Musculoskeletal: No aggressive osseous lesion.  Other: No free fluid.  IMPRESSION: 1. No new acute findings in the abdomen pelvis. 2. No bowel abnormality.   Electronically Signed   By: Genevive Bi M.D.   On: 03/06/2015 10:59      Samuel Jester, DO 03/10/15 1428

## 2017-01-08 ENCOUNTER — Inpatient Hospital Stay (HOSPITAL_COMMUNITY)
Admission: EM | Admit: 2017-01-08 | Discharge: 2017-01-11 | DRG: 557 | Discharge status: Against Medical Advice | Payer: Self-pay | Attending: Internal Medicine | Admitting: Internal Medicine

## 2017-01-08 ENCOUNTER — Emergency Department (HOSPITAL_COMMUNITY): Payer: Self-pay

## 2017-01-08 ENCOUNTER — Encounter (HOSPITAL_COMMUNITY): Payer: Self-pay | Admitting: Emergency Medicine

## 2017-01-08 DIAGNOSIS — J189 Pneumonia, unspecified organism: Secondary | ICD-10-CM | POA: Diagnosis present

## 2017-01-08 DIAGNOSIS — R7401 Elevation of levels of liver transaminase levels: Secondary | ICD-10-CM | POA: Diagnosis present

## 2017-01-08 DIAGNOSIS — T40601A Poisoning by unspecified narcotics, accidental (unintentional), initial encounter: Secondary | ICD-10-CM | POA: Diagnosis present

## 2017-01-08 DIAGNOSIS — M6282 Rhabdomyolysis: Principal | ICD-10-CM | POA: Diagnosis present

## 2017-01-08 DIAGNOSIS — R740 Nonspecific elevation of levels of transaminase and lactic acid dehydrogenase [LDH]: Secondary | ICD-10-CM | POA: Diagnosis present

## 2017-01-08 DIAGNOSIS — J181 Lobar pneumonia, unspecified organism: Secondary | ICD-10-CM

## 2017-01-08 DIAGNOSIS — R29898 Other symptoms and signs involving the musculoskeletal system: Secondary | ICD-10-CM | POA: Insufficient documentation

## 2017-01-08 DIAGNOSIS — F1110 Opioid abuse, uncomplicated: Secondary | ICD-10-CM | POA: Diagnosis present

## 2017-01-08 DIAGNOSIS — R000 Tachycardia, unspecified: Secondary | ICD-10-CM | POA: Diagnosis present

## 2017-01-08 DIAGNOSIS — Z79899 Other long term (current) drug therapy: Secondary | ICD-10-CM

## 2017-01-08 DIAGNOSIS — F17210 Nicotine dependence, cigarettes, uncomplicated: Secondary | ICD-10-CM | POA: Diagnosis present

## 2017-01-08 DIAGNOSIS — F1410 Cocaine abuse, uncomplicated: Secondary | ICD-10-CM | POA: Diagnosis present

## 2017-01-08 DIAGNOSIS — T401X1A Poisoning by heroin, accidental (unintentional), initial encounter: Secondary | ICD-10-CM | POA: Diagnosis present

## 2017-01-08 DIAGNOSIS — N182 Chronic kidney disease, stage 2 (mild): Secondary | ICD-10-CM | POA: Diagnosis present

## 2017-01-08 DIAGNOSIS — F1310 Sedative, hypnotic or anxiolytic abuse, uncomplicated: Secondary | ICD-10-CM | POA: Diagnosis present

## 2017-01-08 LAB — COMPREHENSIVE METABOLIC PANEL
ALT: 70 U/L — AB (ref 17–63)
AST: 169 U/L — AB (ref 15–41)
Albumin: 4 g/dL (ref 3.5–5.0)
Alkaline Phosphatase: 66 U/L (ref 38–126)
Anion gap: 4 — ABNORMAL LOW (ref 5–15)
BILIRUBIN TOTAL: 0.5 mg/dL (ref 0.3–1.2)
BUN: 21 mg/dL — AB (ref 6–20)
CHLORIDE: 112 mmol/L — AB (ref 101–111)
CO2: 27 mmol/L (ref 22–32)
CREATININE: 1.51 mg/dL — AB (ref 0.61–1.24)
Calcium: 8.2 mg/dL — ABNORMAL LOW (ref 8.9–10.3)
GFR calc Af Amer: >60 mL/min (ref >60)
Glucose, Bld: 95 mg/dL (ref 65–99)
Potassium: 4.2 mmol/L (ref 3.5–5.1)
Sodium: 143 mmol/L (ref 135–145)
Total Protein: 6.2 g/dL — ABNORMAL LOW (ref 6.5–8.1)

## 2017-01-08 LAB — CBC
HEMATOCRIT: 42.3 % (ref 39.0–52.0)
Hemoglobin: 14.4 g/dL (ref 13.0–17.0)
MCH: 31.2 pg (ref 26.0–34.0)
MCHC: 34 g/dL (ref 30.0–36.0)
MCV: 91.6 fL (ref 78.0–100.0)
PLATELETS: 168 10*3/uL (ref 150–400)
RBC: 4.62 MIL/uL (ref 4.22–5.81)
RDW: 12.4 % (ref 11.5–15.5)
WBC: 14.8 10*3/uL — ABNORMAL HIGH (ref 4.0–10.5)

## 2017-01-08 LAB — ETHANOL

## 2017-01-08 LAB — RAPID URINE DRUG SCREEN, HOSP PERFORMED
Amphetamines: NOT DETECTED
Barbiturates: NOT DETECTED
Benzodiazepines: POSITIVE — AB
COCAINE: POSITIVE — AB
OPIATES: POSITIVE — AB
Tetrahydrocannabinol: NOT DETECTED

## 2017-01-08 LAB — CK: Total CK: 11609 U/L — ABNORMAL HIGH (ref 49–397)

## 2017-01-08 MED ORDER — NICOTINE 21 MG/24HR TD PT24
21.0000 mg | MEDICATED_PATCH | Freq: Once | TRANSDERMAL | Status: DC
  Administered 2017-01-08: 21 mg via TRANSDERMAL
  Filled 2017-01-08: qty 1

## 2017-01-08 MED ORDER — SODIUM CHLORIDE 0.9 % IV SOLN
1000.0000 mL | INTRAVENOUS | Status: DC
  Administered 2017-01-08 – 2017-01-09 (×2): 1000 mL via INTRAVENOUS

## 2017-01-08 MED ORDER — SODIUM CHLORIDE 0.9 % IV SOLN: INTRAVENOUS | Status: DC

## 2017-01-08 MED ORDER — SODIUM CHLORIDE 0.9 % IV SOLN
3.0000 g | Freq: Four times a day (QID) | INTRAVENOUS | Status: DC
  Administered 2017-01-08 – 2017-01-11 (×11): 3 g via INTRAVENOUS
  Filled 2017-01-08 (×13): qty 3

## 2017-01-08 MED ORDER — LORAZEPAM 2 MG/ML IJ SOLN
2.0000 mg | INTRAMUSCULAR | Status: DC | PRN
  Administered 2017-01-09 – 2017-01-10 (×8): 2 mg via INTRAVENOUS
  Filled 2017-01-08 (×8): qty 1

## 2017-01-08 MED ORDER — NALOXONE HCL 0.4 MG/ML IJ SOLN
0.4000 mg | Freq: Once | INTRAMUSCULAR | Status: AC
  Administered 2017-01-08: 0.4 mg via INTRAVENOUS
  Filled 2017-01-08: qty 1

## 2017-01-08 MED ORDER — SODIUM CHLORIDE 0.9 % IV BOLUS (SEPSIS)
1000.0000 mL | Freq: Once | INTRAVENOUS | Status: AC
  Administered 2017-01-08: 1000 mL via INTRAVENOUS

## 2017-01-08 MED ORDER — ENOXAPARIN SODIUM 40 MG/0.4ML ~~LOC~~ SOLN
40.0000 mg | SUBCUTANEOUS | Status: DC
Start: 2017-01-08 — End: 2017-01-11
  Administered 2017-01-08 – 2017-01-10 (×3): 40 mg via SUBCUTANEOUS
  Filled 2017-01-08 (×3): qty 0.4

## 2017-01-08 MED ORDER — DEXTROSE 5 % IV SOLN
1.0000 g | Freq: Once | INTRAVENOUS | Status: AC
  Administered 2017-01-08: 1 g via INTRAVENOUS
  Filled 2017-01-08: qty 10

## 2017-01-08 MED ORDER — DEXTROSE 5 % IV SOLN
500.0000 mg | Freq: Once | INTRAVENOUS | Status: AC
  Administered 2017-01-08: 500 mg via INTRAVENOUS
  Filled 2017-01-08: qty 500

## 2017-01-08 MED ORDER — GADOBENATE DIMEGLUMINE 529 MG/ML IV SOLN
15.0000 mL | Freq: Once | INTRAVENOUS | Status: AC | PRN
  Administered 2017-01-08: 12 mL via INTRAVENOUS

## 2017-01-08 MED ORDER — SODIUM CHLORIDE 0.9 % IV SOLN
INTRAVENOUS | Status: AC
  Administered 2017-01-08: 22:00:00 via INTRAVENOUS

## 2017-01-08 MED ORDER — SODIUM CHLORIDE 0.9 % IV SOLN
1000.0000 mL | Freq: Once | INTRAVENOUS | Status: AC
  Administered 2017-01-08: 1000 mL via INTRAVENOUS

## 2017-01-08 NOTE — H&P (Addendum)
History and Physical    Spencer Rogers XLK:[id number] DOB: [birth year] DOA: 01/08/2017  PCP: No PCP Per Patient   Patient coming from: Home  Chief Complaint: Found down, polysubstance abuse  HPI: Spencer Rogers is a 21 y.o. male with medical history significant for polysubstance abuse including IV drug abuse, and bipolar disorder who presents to the emergency department after being found down at home with his girlfriend. Patient had reportedly been injecting heroin and cocaine throughout the night last night and into this morning. He reports injecting 1 g of heroin overnight with his last use at approximately 10 AM on the day of presentation. Someone called EMS and the patient and his girlfriend were both found lying on the floor and difficult to arouse. He was brought into the ED for further evaluation. Patient denies fevers or chills and denies abdominal pain, nausea, vomiting, or diarrhea. He also denies chest pain or palpitations. He notes bilateral calf pain and weakness. He denies headache, change in vision or hearing, or loss of correlation. Patient reports that he had been clean for a while before relapsing last night. Per report, his girlfriend required intubation and has a lactic acid of 14.   ED Course: Upon arrival to the ED, patient is found to be afebrile, saturating adequately on room air, tachycardic in the 120s, and was soft but stable blood pressure. Chemistry panels notable for BUN of 21 and creatinine 1.51, up from 1.3 in 2016. Also noted on chemistry panel is AST of 169, ALT of 70, knee chloride 112, and anion gap of 4. Marland Kitchen CBC features a leukocytosis to 14,800. UDS is positive for cocaine, benzodiazepines, and opiates. Ethanol level is undetectable. Patient was too weak to stand or walk in the emergency department and MRI of the thoracic and lumbar spine was obtained and negative for acute spinal pathology, but notable for a patchy left lower lobe pneumonia. Serum CK is elevated to a value  of 11,609. Patient was given 3 L of normal saline in the emergency department and treated with azithromycin and Rocephin. He has remained somnolent, but easily roused. BP remains soft but stable and tachycardia has nearly resolved. He will be observed on the medical-surgical unit for ongoing evaluation and management of rhabdomyolysis which is likely secondary to cocaine and opiate abuse.   Review of Systems:  All other systems reviewed and apart from HPI, are negative.  Past Medical History:  Diagnosis Date  . Bipolar 1 disorder ([place])   . History of drug overdose   . Polysubstance abuse    opiates, benzos, cocaine, marijuana  . PTSD (post-traumatic stress disorder)   . Substance induced mood disorder [facility])     Past Surgical History:  Procedure Laterality Date  . unknown       reports that he has been smoking Cigarettes.  He has a 10.00 pack-year smoking history. He has never used smokeless tobacco. He reports that he drinks alcohol. He reports that he uses drugs, including Marijuana, Heroin, Benzodiazepines, IV, and Cocaine.  No Known Allergies  History reviewed. No pertinent family history.   Prior to Admission medications   Medication Sig Start Date End Date Taking? Authorizing Provider  clonazePAM (KLONOPIN) 1 MG tablet Take 1 mg by mouth once.   Yes Historical Provider, MD  dicyclomine (BENTYL) 20 MG tablet Take 1 tablet (20 mg total) by mouth every 6 (six) hours as needed for spasms (abdominal cramping). Patient not taking: Reported on 01/08/2017 03/06/15   Francine Graven, DO  hydrOXYzine (ATARAX/VISTARIL) 25 MG tablet Take 1 tablet (25 mg) three times daily as needed: For anxiety Patient not taking: Reported on 03/06/2015 05/25/14   Encarnacion Slates, NP  ondansetron (ZOFRAN) 4 MG tablet Take 1 tablet (4 mg total) by mouth every 8 (eight) hours as needed for nausea or vomiting. Patient not taking: Reported on 01/08/2017 03/06/15   Francine Graven, DO  OVER THE COUNTER MEDICATION  Kratom herb from local smoke shop to help with opiod abuse  Takes 12 capsules in the morning and 2-3 capsules every 15 minutes throughout the day    Historical Provider, MD  traZODone (DESYREL) 50 MG tablet Take 1 tablet (50 mg total) by mouth at bedtime as needed for sleep. Patient not taking: Reported on 03/06/2015 05/25/14   Encarnacion Slates, NP    Physical Exam: Vitals:   01/08/17 1904 01/08/17 1930 01/08/17 2000 01/08/17 2032  BP: (!) 99/58 99/66 101/70 108/64  Pulse: (!) 105 (!) 106 (!) 103 (!) 102  Resp: '12 17 16 15  '$ Temp:      TempSrc:      SpO2: 97% 98% 99% 100%  Weight:      Height:          Constitutional: NAD, calm, somnolent but rousable Eyes: PERTLA, lids and conjunctivae normal ENMT: Mucous membranes are moist. Posterior pharynx clear of any exudate or lesions.   Neck: normal, supple, no masses, no thyromegaly Respiratory: Coarse upper airway sounds and rhonchi at left base. Normal respiratory effort. No accessory muscle use. Occasional cough. Cardiovascular: Rate ~110 and regular. No extremity edema. No significant JVD. Abdomen: No distension, no tenderness, no masses palpated. Bowel sounds normal.  Musculoskeletal: no clubbing / cyanosis. No joint deformity upper and lower extremities. Calves tender bilaterally without edema, discoloration, or palpable cord.  Skin: no significant rashes, lesions, ulcers. Warm, dry, well-perfused. Neurologic: CN 2-12 grossly intact. Sensation intact, DTR normal. Plantar- and dorsiflexion 3-4/5 bilaterally.  Psychiatric: Somnolent, but wakes to voice. Oriented x 3.    Labs on Admission: I have personally reviewed following labs and imaging studies  CBC:  Recent Labs Lab 01/08/17 1303  WBC 14.8*  HGB 14.4  HCT 42.3  MCV 91.6  PLT 188   Basic Metabolic Panel:  Recent Labs Lab 01/08/17 1303  NA 143  K 4.2  CL 112*  CO2 27  GLUCOSE 95  BUN 21*  CREATININE 1.51*  CALCIUM 8.2*   GFR: Estimated Creatinine Clearance:  64.5 mL/min (A) (by C-G formula based on SCr of 1.51 mg/dL (H)). Liver Function Tests:  Recent Labs Lab 01/08/17 1303  AST 169*  ALT 70*  ALKPHOS 66  BILITOT 0.5  PROT 6.2*  ALBUMIN 4.0   No results for input(s): LIPASE, AMYLASE in the last 168 hours. No results for input(s): AMMONIA in the last 168 hours. Coagulation Profile: No results for input(s): INR, PROTIME in the last 168 hours. Cardiac Enzymes:  Recent Labs Lab 01/08/17 1548  CKTOTAL 11,609*   BNP (last 3 results) No results for input(s): PROBNP in the last 8760 hours. HbA1C: No results for input(s): HGBA1C in the last 72 hours. CBG: No results for input(s): GLUCAP in the last 168 hours. Lipid Profile: No results for input(s): CHOL, HDL, LDLCALC, TRIG, CHOLHDL, LDLDIRECT in the last 72 hours. Thyroid Function Tests: No results for input(s): TSH, T4TOTAL, FREET4, T3FREE, THYROIDAB in the last 72 hours. Anemia Panel: No results for input(s): VITAMINB12, FOLATE, FERRITIN, TIBC, IRON, RETICCTPCT in the last 72 hours. Urine  analysis:    Component Value Date/Time   COLORURINE YELLOW 03/06/2015 1138   APPEARANCEUR CLEAR 03/06/2015 1138   LABSPEC >1.046 (H) 03/06/2015 1138   PHURINE 7.0 03/06/2015 1138   GLUCOSEU NEGATIVE 03/06/2015 1138   HGBUR NEGATIVE 03/06/2015 1138   BILIRUBINUR NEGATIVE 03/06/2015 1138   KETONESUR 15 (A) 03/06/2015 1138   PROTEINUR 100 (A) 03/06/2015 1138   UROBILINOGEN 1.0 03/06/2015 1138   NITRITE NEGATIVE 03/06/2015 1138   LEUKOCYTESUR NEGATIVE 03/06/2015 1138   Sepsis Labs: '@LABRCNTIP'$ (procalcitonin:4,lacticidven:4) )No results found for this or any previous visit (from the past 240 hour(s)).   Radiological Exams on Admission: Mr Thoracic Spine W Wo Contrast  Result Date: 01/08/2017 CLINICAL DATA:  Lower extremity weakness and leg weakness after injecting 1 g heroin last night. EXAM: MRI THORACIC AND LUMBAR SPINE WITHOUT AND WITH CONTRAST TECHNIQUE: Multiplanar and multiecho pulse  sequences of the thoracic and lumbar spine were obtained without and with intravenous contrast. CONTRAST:  28m MULTIHANCE GADOBENATE DIMEGLUMINE 529 MG/ML IV SOLN COMPARISON:  None. FINDINGS: MRI THORACIC SPINE FINDINGS- multiple sequences are moderately or severely motion degraded. ALIGNMENT: Maintenance of the thoracic kyphosis. No malalignment. VERTEBRAE/DISCS: Vertebral bodies are intact. Intervertebral discs morphology and signal are normal. Scattered old Schmorl's node. No abnormal bone marrow signal or acute osseous process. No abnormal osseous or disc enhancement. Scattered old Schmorl's nodes. CORD: Thoracic spinal cord is normal morphology and signal characteristics to the level of the conus medullaris which terminates at T12-L1. No abnormal cord or leptomeningeal enhancement. Linear dorsal epidural enhancement corresponding to flow voids on T2 weighted sequences ; limited evaluation due to severely motion degraded axial post gadolinium sequences. PREVERTEBRAL AND PARASPINAL SOFT TISSUES: Patchy airspace opacity LEFT lower lobe. DISC LEVELS: No significant disc bulge, canal stenosis or neural foraminal narrowing at any level. MRI LUMBAR SPINE FINDINGS SEGMENTATION: For the purposes of this report, the last well-formed intervertebral disc will be described as L5-S1. ALIGNMENT: Maintenance of the lumbar lordosis. No malalignment. VERTEBRAE:Vertebral bodies are intact. Intervertebral discs demonstrate normal morphology and signal characteristics. No abnormal bone marrow signal. No abnormal osseous or intradiscal enhancement. Scattered old Schmorl's nodes. CONUS MEDULLARIS: Conus medullaris terminates at L1-2 and demonstrates normal morphology and signal characteristics. Cauda equina is normal. No abnormal cord, leptomeningeal or epidural enhancement. PARASPINAL AND SOFT TISSUES: Included prevertebral and paraspinal soft tissues are normal. DISC LEVELS: L1-2 thru L3-4: No disc bulge, canal stenosis nor neural  foraminal narrowing. L4-5: No disc bulge. Mild facet arthropathy and ligamentum flavum redundancy without canal stenosis. No neural foraminal narrowing. L5-S1: Annular bulging. Minimal facet arthropathy without canal stenosis. Minimal neural foraminal narrowing. IMPRESSION: MRI THORACIC SPINE: Motion degraded examination without convincing evidence of spinal infection or spinal cord pathology. No acute osseous process, no canal stenosis or neural foraminal narrowing. Patchy LEFT lower lobe suspected pneumonia. MRI LUMBAR SPINE: Motion degraded examination without convincing evidence of spinal infection or spinal cord pathology. No acute osseous process, no canal stenosis. Minimal L5-S1 neural foraminal narrowing. Electronically Signed   By: CElon AlasM.D.   On: 01/08/2017 18:58   Mr Lumbar Spine W Wo Contrast  Result Date: 01/08/2017 CLINICAL DATA:  Lower extremity weakness and leg weakness after injecting 1 g heroin last night. EXAM: MRI THORACIC AND LUMBAR SPINE WITHOUT AND WITH CONTRAST TECHNIQUE: Multiplanar and multiecho pulse sequences of the thoracic and lumbar spine were obtained without and with intravenous contrast. CONTRAST:  155mMULTIHANCE GADOBENATE DIMEGLUMINE 529 MG/ML IV SOLN COMPARISON:  None. FINDINGS: MRI THORACIC SPINE FINDINGS- multiple  sequences are moderately or severely motion degraded. ALIGNMENT: Maintenance of the thoracic kyphosis. No malalignment. VERTEBRAE/DISCS: Vertebral bodies are intact. Intervertebral discs morphology and signal are normal. Scattered old Schmorl's node. No abnormal bone marrow signal or acute osseous process. No abnormal osseous or disc enhancement. Scattered old Schmorl's nodes. CORD: Thoracic spinal cord is normal morphology and signal characteristics to the level of the conus medullaris which terminates at T12-L1. No abnormal cord or leptomeningeal enhancement. Linear dorsal epidural enhancement corresponding to flow voids on T2 weighted sequences ;  limited evaluation due to severely motion degraded axial post gadolinium sequences. PREVERTEBRAL AND PARASPINAL SOFT TISSUES: Patchy airspace opacity LEFT lower lobe. DISC LEVELS: No significant disc bulge, canal stenosis or neural foraminal narrowing at any level. MRI LUMBAR SPINE FINDINGS SEGMENTATION: For the purposes of this report, the last well-formed intervertebral disc will be described as L5-S1. ALIGNMENT: Maintenance of the lumbar lordosis. No malalignment. VERTEBRAE:Vertebral bodies are intact. Intervertebral discs demonstrate normal morphology and signal characteristics. No abnormal bone marrow signal. No abnormal osseous or intradiscal enhancement. Scattered old Schmorl's nodes. CONUS MEDULLARIS: Conus medullaris terminates at L1-2 and demonstrates normal morphology and signal characteristics. Cauda equina is normal. No abnormal cord, leptomeningeal or epidural enhancement. PARASPINAL AND SOFT TISSUES: Included prevertebral and paraspinal soft tissues are normal. DISC LEVELS: L1-2 thru L3-4: No disc bulge, canal stenosis nor neural foraminal narrowing. L4-5: No disc bulge. Mild facet arthropathy and ligamentum flavum redundancy without canal stenosis. No neural foraminal narrowing. L5-S1: Annular bulging. Minimal facet arthropathy without canal stenosis. Minimal neural foraminal narrowing. IMPRESSION: MRI THORACIC SPINE: Motion degraded examination without convincing evidence of spinal infection or spinal cord pathology. No acute osseous process, no canal stenosis or neural foraminal narrowing. Patchy LEFT lower lobe suspected pneumonia. MRI LUMBAR SPINE: Motion degraded examination without convincing evidence of spinal infection or spinal cord pathology. No acute osseous process, no canal stenosis. Minimal L5-S1 neural foraminal narrowing. Electronically Signed   By: Elon Alas M.D.   On: 01/08/2017 18:58    EKG: Ordered and pending.   Assessment/Plan  1. Rhabdomyolysis  - Pt presents  after heavy use of IV heroin and cocaine the night prior and into the am of presentation - Noted to have BLE weakness with CK of 11,600 and mild renal insufficiency; thoracic and lumbar spine MRI obtained and essentially negative  - He has been hydrated with 3 liters NS in ED and is continued on NS infusion  - Repeat chem panel and CK level in am   2. Opiate overdose, IVDU, polysubstance abuse  - Pt presents after heavy use of IV heroin and cocaine the night prior and into the am of presentation - He reports a period of abstinence before relapsing the night prior to admit  - He is protecting his airway, oxygenating and mentating appropriatley  - Social work consultation is requested    3. Pneumonia  - Rhonchi and cough noted; T-spine MRI reveals patchy LLL consolidation  - He was treated with empiric Rocephin and azithromycin in ED  - Likely secondary to aspiration given the history  - Check sputum culture and continue empiric abx with Unasyn    4. CKD stage II - SCr is 1.51 on admission, up from 1.3 in 2016  - May be an acute component in setting of rhabdomyolysis  - He is being hydrated as above, nephrotoxins will be avoided, and chem panel will be repeated in am    5. Elevated transaminases - AST 169 and ALT 70 on  admission  - No RUQ tenderness; bilirubin and alk phos wnl  - Likely secondary to rhabdomyolysis, will trend  - Given hx of IVDA, will check viral hepatitis panel    DVT prophylaxis: sq Lovenox  Code Status: Full  Family Communication: Discussed with patient Disposition Plan: Observe on med-surg Consults called: None Admission status: Observation    Vianne Bulls, MD [facility] Pager [phone]  If 7PM-7AM, please contact night-coverage [email or web address] Password [facility]  01/08/2017, 8:33 PM

## 2017-01-08 NOTE — ED Notes (Signed)
Pt given meal and juice along with warm blanket.

## 2017-01-08 NOTE — ED Triage Notes (Addendum)
Patient was found at home.  He states he injected 1 gram of heroin, since last night.  Patient is complaining of lower extremity weakness and tingling in legs.     BP:  95/55 HR: 119 R: 12-16 98% on room air   CBG: 134

## 2017-01-08 NOTE — Progress Notes (Signed)
Pharmacy Antibiotic Note  Spencer Rogers is a 21 y.o. male admitted on 01/08/2017 with drug overdose, elevated CK, and aspiration pneumonia. Pharmacy has been consulted for Unasyn dosing.  Plan: Unasyn 3g IV q6h. Monitor renal function, culture results as available, clinical course.  Height:  (180.3 cm) Weight: 131 lb (59.4 kg) IBW/kg (Calculated) : 75.3  Temp (24hrs), Avg:98.6 F (37 C), Min:98.6 F (37 C), Max:98.6 F (37 C)   Recent Labs Lab 01/08/17 1303  WBC 14.8*  CREATININE 1.51*    Estimated Creatinine Clearance: 64.5 mL/min (A) (by C-G formula based on SCr of 1.51 mg/dL (H)).    No Known Allergies  Antimicrobials this admission: 4/10 >> Ceftriaxone x 1 4/10 >> Azithromycin x 1 4/10 >> Unasyn >>  Dose adjustments this admission: --  Microbiology results: 4/10 Sputum: ordered   Thank you for allowing pharmacy to be a part of this patient's care.   Greer Pickerel, PharmD, BCPS Pager: [phone] 01/08/2017 8:50 PM

## 2017-01-08 NOTE — ED Notes (Signed)
Hospitalist and admitting RN at bedside.

## 2017-01-08 NOTE — ED Notes (Signed)
Pt unable to stand due to reported weakness in thighs. MD Pfeiffer aware and visualized this.

## 2017-01-08 NOTE — ED Provider Notes (Signed)
Patient found to have a CK of 11,000 which is most likely the cause of him being unable to walk. MRI negative for epidural abscess, hemorrhage or infarct. Continue fluid hydration. Patient also found to have a left lower lobe consolidation concerning for aspiration. Covered for Rocephin and azithromycin.  For further care.   Gwyneth Sprout, MD 01/08/17 2012

## 2017-01-08 NOTE — ED Notes (Signed)
Patient transported to MRI 

## 2017-01-08 NOTE — ED Notes (Signed)
ED Provider at bedside. 

## 2017-01-08 NOTE — ED Notes (Signed)
Bed: WA17 Expected date:  Expected time:  Means of arrival:  Comments: EMS-OD 

## 2017-01-08 NOTE — ED Provider Notes (Signed)
[facility]-EMERGENCY DEPT Provider Note   CSN: [id number] Arrival date & time: 01/08/17  1154     History   Chief Complaint Chief Complaint  Patient presents with  . Drug Overdose    HPI Spencer Rogers is a 21 y.o. male.  HPI Patient is a poor historian. He reports he "overdosed on heroin". He reported to EMS, 1 g of heroin since last night. Patient was not specific on his use for me. He indicated last used may have been 9 or 10 AM. Reportedly the patient has stated he was having lower extrermity weakness and tingling. Now he is equivocal on whether or not this is a problem. He is however fairly drowsy and only moderately cooperative.  Reassessment for history 15:30. Patient is more awake now and oriented to situation. His recall of the night is still somewhat poor. He now states that he had been clean for quite a while and had a relapse last night. He reports both he and his girlfriend reportedly had been laid out on the floor for a while but he can't really specify more and how he ended up getting to the emergency department. He is denying other additional drug use. He still reports that his legs feel weak and that he can't put weight on them when trying to stand. He states he is hungry and thirsty. Past Medical History:  Diagnosis Date  . Bipolar 1 disorder (HCC)   . History of drug overdose   . Polysubstance abuse    opiates, benzos, cocaine, marijuana  . PTSD (post-traumatic stress disorder)   . Substance induced mood disorder [facility])     Patient Active Problem List   Diagnosis Date Noted  . Posttraumatic stress disorder 05/22/2014  . Suicidal ideations 05/22/2014  . Bereavement 05/22/2014  . Substance induced mood disorder (HCC) 05/22/2014  . Severe opioid use disorder (HCC) 05/22/2014  . Opiate overdose 05/18/2014  . Cocaine abuse 05/18/2014  . Cannabis abuse 05/18/2014  . Benzodiazepine abuse 05/18/2014    Past Surgical History:  Procedure Laterality Date  . unknown          Home Medications    Prior to Admission medications   Medication Sig Start Date End Date Taking? Authorizing Provider  clonazePAM (KLONOPIN) 1 MG tablet Take 1 mg by mouth once.   Yes Historical Provider, MD  dicyclomine (BENTYL) 20 MG tablet Take 1 tablet (20 mg total) by mouth every 6 (six) hours as needed for spasms (abdominal cramping). Patient not taking: Reported on 01/08/2017 03/06/15   Samuel Jester, DO  hydrOXYzine (ATARAX/VISTARIL) 25 MG tablet Take 1 tablet (25 mg) three times daily as needed: For anxiety Patient not taking: Reported on 03/06/2015 05/25/14   Sanjuana Kava, NP  ondansetron (ZOFRAN) 4 MG tablet Take 1 tablet (4 mg total) by mouth every 8 (eight) hours as needed for nausea or vomiting. Patient not taking: Reported on 01/08/2017 03/06/15   Samuel Jester, DO  OVER THE COUNTER MEDICATION Kratom herb from local smoke shop to help with opiod abuse  Takes 12 capsules in the morning and 2-3 capsules every 15 minutes throughout the day    Historical Provider, MD  traZODone (DESYREL) 50 MG tablet Take 1 tablet (50 mg total) by mouth at bedtime as needed for sleep. Patient not taking: Reported on 03/06/2015 05/25/14   Sanjuana Kava, NP    Family History History reviewed. No pertinent family history.  Social History Social History  Substance Use Topics  . Smoking status:  Current Every Day Smoker    Packs/day: 1.00    Years: 10.00    Types: Cigarettes  . Smokeless tobacco: Never Used  . Alcohol use Yes     Comment: occasionally      Allergies   Patient has no known allergies.   Review of Systems Review of Systems Cannot obtain due to patient condition. Somnolent and poorly cooperative  Physical Exam Updated Vital Signs BP 99/68   Pulse (!) 105   Temp 98.6 F (37 C) (Oral)   Resp 19   Ht  (1.803 m)   Wt 131 lb (59.4 kg)   SpO2 97%   BMI 18.27 kg/m   Physical Exam  Constitutional:  Patient is sleeping in the stretcher in a semi-recombinant  position. No respiratory distress at rest. He is disheveled and poorly groomed. Patient is very thin .  HENT:  Head: Normocephalic and atraumatic.  Tongue has white plaque on it and dentition is in very poor condition. Posterior oropharynx patent.  Eyes: EOM are normal.  Pupils are approximately 3 mm and symmetric. Patient does awaken and extraocular motions are symmetric  Cardiovascular:  Mild tachycardia. No rub murmur gallop.  Pulmonary/Chest: Effort normal and breath sounds normal.  Abdominal: Soft. He exhibits no distension. There is no tenderness.  Musculoskeletal: He exhibits no edema or tenderness.  Neurological:  She is somnolent but does awaken to be oriented to person place and time. He can follow commands to pull himself forward in the stretcher but exhibits low effort and falls back asleep frequently. No focal deficit.  Skin: Skin is warm and dry.     ED Treatments / Results  Labs (all labs ordered are listed, but only abnormal results are displayed) Labs Reviewed  COMPREHENSIVE METABOLIC PANEL - Abnormal; Notable for the following:       Result Value   Chloride 112 (*)    BUN 21 (*)    Creatinine, Ser 1.51 (*)    Calcium 8.2 (*)    Total Protein 6.2 (*)    AST 169 (*)    ALT 70 (*)    Anion gap 4 (*)    All other components within normal limits  CBC - Abnormal; Notable for the following:    WBC 14.8 (*)    All other components within normal limits  ETHANOL  RAPID URINE DRUG SCREEN, HOSP PERFORMED  CK    EKG  EKG Interpretation None       Radiology No results found.  Procedures Procedures (including critical care time)  Medications Ordered in ED Medications  0.9 %  sodium chloride infusion (not administered)    Followed by  0.9 %  sodium chloride infusion (not administered)  naloxone (NARCAN) injection 0.4 mg (0.4 mg Intravenous Given 01/08/17 1359)     Initial Impression / Assessment and Plan / ED Course  I have reviewed the triage vital  signs and the nursing notes.  Pertinent labs & imaging results that were available during my care of the patient were reviewed by me and considered in my medical decision making (see chart for details).     15:30 repeat attempt at gait testing. Patient can flex his legs and pick them up while in the bed. When helped to sit on the edge of the bed, he is able to move his lower legs and feet to place him in his slippers. When asked to stand and bear weight he buckles his knees and states that he is weak in  his upper legs and knees. He will not fully bear weight and ambulate. Lab will be to proceed with MR of T-spine and L-spine. Dr. Anitra Lauth will follow-up on results of MR and reassess for gait stability pending results. Patient is alert and nontoxic. He does not exhibit respiratory distress. He is aware of person place and time. He now follows commands.  Final Clinical Impressions(s) / ED Diagnoses   Final diagnoses:  Lower extremity weakness  Accidental overdose of heroin, initial encounter    New Prescriptions New Prescriptions   No medications on file     Arby Barrette, MD 01/08/17 1552

## 2017-01-08 NOTE — ED Notes (Signed)
Pt remains in MRI 

## 2017-01-09 DIAGNOSIS — R740 Nonspecific elevation of levels of transaminase and lactic acid dehydrogenase [LDH]: Secondary | ICD-10-CM

## 2017-01-09 DIAGNOSIS — F1410 Cocaine abuse, uncomplicated: Secondary | ICD-10-CM

## 2017-01-09 DIAGNOSIS — N182 Chronic kidney disease, stage 2 (mild): Secondary | ICD-10-CM

## 2017-01-09 DIAGNOSIS — F1310 Sedative, hypnotic or anxiolytic abuse, uncomplicated: Secondary | ICD-10-CM

## 2017-01-09 DIAGNOSIS — M6282 Rhabdomyolysis: Principal | ICD-10-CM

## 2017-01-09 DIAGNOSIS — T40601S Poisoning by unspecified narcotics, accidental (unintentional), sequela: Secondary | ICD-10-CM

## 2017-01-09 LAB — CBC WITH DIFFERENTIAL/PLATELET
BASOS ABS: 0 10*3/uL (ref 0.0–0.1)
Basophils Relative: 0 %
EOS PCT: 1 %
Eosinophils Absolute: 0.1 10*3/uL (ref 0.0–0.7)
HCT: 35 % — ABNORMAL LOW (ref 39.0–52.0)
Hemoglobin: 11.9 g/dL — ABNORMAL LOW (ref 13.0–17.0)
LYMPHS PCT: 18 %
Lymphs Abs: 1.7 10*3/uL (ref 0.7–4.0)
MCH: 32.2 pg (ref 26.0–34.0)
MCHC: 34 g/dL (ref 30.0–36.0)
MCV: 94.6 fL (ref 78.0–100.0)
MONO ABS: 1.2 10*3/uL — AB (ref 0.1–1.0)
Monocytes Relative: 13 %
Neutro Abs: 6.6 10*3/uL (ref 1.7–7.7)
Neutrophils Relative %: 68 %
PLATELETS: 151 10*3/uL (ref 150–400)
RBC: 3.7 MIL/uL — ABNORMAL LOW (ref 4.22–5.81)
RDW: 12.7 % (ref 11.5–15.5)
WBC: 9.6 10*3/uL (ref 4.0–10.5)

## 2017-01-09 LAB — URINALYSIS, ROUTINE W REFLEX MICROSCOPIC
Bilirubin Urine: NEGATIVE
Glucose, UA: NEGATIVE mg/dL
Ketones, ur: NEGATIVE mg/dL
Nitrite: NEGATIVE
Protein, ur: 30 mg/dL — AB
SPECIFIC GRAVITY, URINE: 1.026 (ref 1.005–1.030)
pH: 5 (ref 5.0–8.0)

## 2017-01-09 LAB — COMPREHENSIVE METABOLIC PANEL
ALBUMIN: 3.2 g/dL — AB (ref 3.5–5.0)
ALK PHOS: 51 U/L (ref 38–126)
ALT: 95 U/L — AB (ref 17–63)
AST: 323 U/L — AB (ref 15–41)
Anion gap: 6 (ref 5–15)
BILIRUBIN TOTAL: 0.4 mg/dL (ref 0.3–1.2)
BUN: 14 mg/dL (ref 6–20)
CALCIUM: 8.2 mg/dL — AB (ref 8.9–10.3)
CO2: 24 mmol/L (ref 22–32)
Chloride: 111 mmol/L (ref 101–111)
Creatinine, Ser: 1.18 mg/dL (ref 0.61–1.24)
GFR calc Af Amer: >60 mL/min (ref >60)
GFR calc non Af Amer: >60 mL/min (ref >60)
GLUCOSE: 123 mg/dL — AB (ref 65–99)
POTASSIUM: 3.4 mmol/L — AB (ref 3.5–5.1)
Sodium: 141 mmol/L (ref 135–145)
TOTAL PROTEIN: 5.1 g/dL — AB (ref 6.5–8.1)

## 2017-01-09 LAB — URINALYSIS, MICROSCOPIC (REFLEX): Bacteria, UA: NONE SEEN

## 2017-01-09 LAB — STREP PNEUMONIAE URINARY ANTIGEN: Strep Pneumo Urinary Antigen: NEGATIVE

## 2017-01-09 LAB — CK: Total CK: 16513 U/L — ABNORMAL HIGH (ref 49–397)

## 2017-01-09 MED ORDER — NICOTINE POLACRILEX 2 MG MT GUM
2.0000 mg | CHEWING_GUM | OROMUCOSAL | Status: DC | PRN
  Administered 2017-01-09 – 2017-01-11 (×11): 2 mg via ORAL
  Filled 2017-01-09 (×15): qty 1

## 2017-01-09 MED ORDER — POTASSIUM CHLORIDE IN NACL 20-0.9 MEQ/L-% IV SOLN
INTRAVENOUS | Status: DC
  Administered 2017-01-09 – 2017-01-10 (×2): via INTRAVENOUS
  Administered 2017-01-10: 125 mL/h via INTRAVENOUS
  Administered 2017-01-11: 06:00:00 via INTRAVENOUS
  Filled 2017-01-09 (×7): qty 1000

## 2017-01-09 MED ORDER — POTASSIUM CHLORIDE CRYS ER 20 MEQ PO TBCR
20.0000 meq | EXTENDED_RELEASE_TABLET | Freq: Once | ORAL | Status: DC
  Filled 2017-01-09: qty 1

## 2017-01-09 MED ORDER — POTASSIUM CHLORIDE 2 MEQ/ML IV SOLN: INTRAVENOUS | Status: DC

## 2017-01-09 MED ORDER — ACETAMINOPHEN 325 MG PO TABS
650.0000 mg | ORAL_TABLET | Freq: Four times a day (QID) | ORAL | Status: DC | PRN
  Administered 2017-01-09 – 2017-01-11 (×5): 650 mg via ORAL
  Filled 2017-01-09 (×5): qty 2

## 2017-01-09 MED ORDER — TRAMADOL HCL 50 MG PO TABS
100.0000 mg | ORAL_TABLET | Freq: Once | ORAL | Status: AC
  Administered 2017-01-09: 100 mg via ORAL
  Filled 2017-01-09: qty 2

## 2017-01-09 NOTE — Progress Notes (Signed)
PHARMACY NOTE -  Unasyn  Pharmacy has been assisting with dosing of Unasyn for aspiration PNA. Dosage remains stable at 3g IV q6 hr and need for further dosage adjustment appears unlikely at present.    Will sign off at this time.  Please reconsult if a change in clinical status warrants re-evaluation of dosage.  Bernadene Person, PharmD, BCPS Pager: [phone] 01/09/2017, 3:23 PM

## 2017-01-09 NOTE — Progress Notes (Signed)
PROGRESS NOTE  Spencer Rogers UEK:[id number] DOB: 05-Nov-1995 DOA: 01/08/2017 PCP: No PCP Per Patient  Brief History:  21 y.o. male with medical history significant for polysubstance abuse including IV drug abuse, and bipolar disorder who presents to the emergency department after being found down at home with his girlfriend. Patient had reportedly been injecting heroin and cocaine throughout the night last night and into this morning. He reports injecting 1 g of heroin overnight with his last use at approximately 10 AM on the day of presentation. Someone called EMS and the patient and his girlfriend were both found lying on the floor and difficult to arouse. He was brought into the ED for further evaluation. Patient denies fevers or chills and denies abdominal pain, nausea, vomiting, or diarrhea. He also denies chest pain or palpitations. He notes bilateral calf pain and weakness. He denies headache, change in vision or hearing, or loss of correlation. Patient reports that he had been clean for 90 days before relapsing 01/07/17.  Serum CK is elevated to a value of 11,609. Patient was given 3 L of normal saline in the emergency department and treated with azithromycin and Rocephin. He has remained somnolent, but easily roused. BP remains soft but stable and tachycardia has nearly resolved. He will be observed on the medical-surgical unit for ongoing evaluation and management of rhabdomyolysis which is likely secondary to cocaine and opiate abuse.   Assessment/Plan: 1. Rhabdomyolysis  - Pt presents after heavy use of IV heroin and cocaine - Noted to have BLE weakness with CK of 11,600 and mild renal insufficiency; thoracic and lumbar spine MRI obtained and essentially negative  - continue IVF - Repeat chem panel and CK level in am   2. Opiate overdose, IVDU, polysubstance abuse  - Pt presents after heavy use of IV heroin and cocaine - Social work consultation is requested    3. Pneumonia  -  Rhonchi and cough noted; T-spine MRI reveals patchy LLL consolidation  - Likely secondary to aspiration given the history  - continue empiric abx with Unasyn    4. CKD stage II - SCr is 1.51 on admission - baseline creatinine 1.2-1.3 - May be an acute component in setting of rhabdomyolysis  - continue IVF  5. Elevated transaminases - likely due to rhabdo - AST 169 and ALT 70 on admission  - No RUQ tenderness; bilirubin and alk phos wnl   - Given hx of IVDA, will check viral hepatitis panel     Disposition Plan:   Home in 2-3 days  Family Communication:   No Family at bedside  Consultants: none   Code Status:  FULL  DVT Prophylaxis:   [place] Lovenox   Procedures: As Listed in Progress Note Above  Antibiotics: None    Subjective: Patient denies fevers, chills, headache, chest pain, dyspnea, nausea, vomiting, diarrhea, abdominal pain, dysuria, hematuria, hematochezia, and melena.   Objective: Vitals:   01/08/17 2030 01/08/17 2032 01/08/17 2131 01/09/17 0550  BP: 108/64 108/64 (!) 101/51 (!) 102/56  Pulse: (!) 102 (!) 102 (!) 113 (!) 107  Resp: '16 15 19 18  '$ Temp:   97.5 F (36.4 C) 98.1 F (36.7 C)  TempSrc:   Oral Oral  SpO2: 99% 100% 100% 98%  Weight:      Height:        Intake/Output Summary (Last 24 hours) at 01/09/17 1238 Last data filed at 01/09/17 1000  Gross per 24 hour  Intake              240 ml  Output              350 ml  Net             -110 ml   Weight change:  Exam:   General:  Pt is alert, follows commands appropriately, not in acute distress  HEENT: No icterus, No thrush, No neck mass, [place]/AT  Cardiovascular: RRR, S1/S2, no rubs, no gallops  Respiratory: CTA bilaterally, no wheezing, no crackles, no rhonchi  Abdomen: Soft/+BS, non tender, non distended, no guarding  Extremities: No edema, No lymphangitis, No petechiae, No rashes, no synovitis   Data Reviewed: I have personally reviewed following labs and imaging studies Basic  Metabolic Panel:  Recent Labs Lab 01/08/17 1303 01/09/17 0510  NA 143 141  K 4.2 3.4*  CL 112* 111  CO2 27 24  GLUCOSE 95 123*  BUN 21* 14  CREATININE 1.51* 1.18  CALCIUM 8.2* 8.2*   Liver Function Tests:  Recent Labs Lab 01/08/17 1303 01/09/17 0510  AST 169* 323*  ALT 70* 95*  ALKPHOS 66 51  BILITOT 0.5 0.4  PROT 6.2* 5.1*  ALBUMIN 4.0 3.2*   No results for input(s): LIPASE, AMYLASE in the last 168 hours. No results for input(s): AMMONIA in the last 168 hours. Coagulation Profile: No results for input(s): INR, PROTIME in the last 168 hours. CBC:  Recent Labs Lab 01/08/17 1303 01/09/17 0510  WBC 14.8* 9.6  NEUTROABS  --  6.6  HGB 14.4 11.9*  HCT 42.3 35.0*  MCV 91.6 94.6  PLT 168 151   Cardiac Enzymes:  Recent Labs Lab 01/08/17 1548 01/09/17 0510  CKTOTAL 11,609* 16,513*   BNP: Invalid input(s): POCBNP CBG: No results for input(s): GLUCAP in the last 168 hours. HbA1C: No results for input(s): HGBA1C in the last 72 hours. Urine analysis:    Component Value Date/Time   COLORURINE YELLOW 01/09/2017 0159   APPEARANCEUR CLOUDY (A) 01/09/2017 0159   LABSPEC 1.026 01/09/2017 0159   PHURINE 5.0 01/09/2017 0159   GLUCOSEU NEGATIVE 01/09/2017 0159   HGBUR SMALL (A) 01/09/2017 0159   BILIRUBINUR NEGATIVE 01/09/2017 0159   KETONESUR NEGATIVE 01/09/2017 0159   PROTEINUR 30 (A) 01/09/2017 0159   UROBILINOGEN 1.0 03/06/2015 1138   NITRITE NEGATIVE 01/09/2017 0159   LEUKOCYTESUR LARGE (A) 01/09/2017 0159   Sepsis Labs: '@LABRCNTIP'$ (procalcitonin:4,lacticidven:4) )No results found for this or any previous visit (from the past 240 hour(s)).   Scheduled Meds: . ampicillin-sulbactam (UNASYN) IV  3 g Intravenous Q6H  . enoxaparin (LOVENOX) injection  40 mg Subcutaneous Q24H  . nicotine  21 mg Transdermal Once   Continuous Infusions: . sodium chloride 1,000 mL (01/09/17 1102)    Procedures/Studies: Mr Thoracic Spine W Wo Contrast  Result Date:  01/08/2017 CLINICAL DATA:  Lower extremity weakness and leg weakness after injecting 1 g heroin last night. EXAM: MRI THORACIC AND LUMBAR SPINE WITHOUT AND WITH CONTRAST TECHNIQUE: Multiplanar and multiecho pulse sequences of the thoracic and lumbar spine were obtained without and with intravenous contrast. CONTRAST:  39m MULTIHANCE GADOBENATE DIMEGLUMINE 529 MG/ML IV SOLN COMPARISON:  None. FINDINGS: MRI THORACIC SPINE FINDINGS- multiple sequences are moderately or severely motion degraded. ALIGNMENT: Maintenance of the thoracic kyphosis. No malalignment. VERTEBRAE/DISCS: Vertebral bodies are intact. Intervertebral discs morphology and signal are normal. Scattered old Schmorl's node. No abnormal bone marrow signal or acute osseous process. No abnormal osseous or disc enhancement. Scattered old Schmorl's  nodes. CORD: Thoracic spinal cord is normal morphology and signal characteristics to the level of the conus medullaris which terminates at T12-L1. No abnormal cord or leptomeningeal enhancement. Linear dorsal epidural enhancement corresponding to flow voids on T2 weighted sequences ; limited evaluation due to severely motion degraded axial post gadolinium sequences. PREVERTEBRAL AND PARASPINAL SOFT TISSUES: Patchy airspace opacity LEFT lower lobe. DISC LEVELS: No significant disc bulge, canal stenosis or neural foraminal narrowing at any level. MRI LUMBAR SPINE FINDINGS SEGMENTATION: For the purposes of this report, the last well-formed intervertebral disc will be described as L5-S1. ALIGNMENT: Maintenance of the lumbar lordosis. No malalignment. VERTEBRAE:Vertebral bodies are intact. Intervertebral discs demonstrate normal morphology and signal characteristics. No abnormal bone marrow signal. No abnormal osseous or intradiscal enhancement. Scattered old Schmorl's nodes. CONUS MEDULLARIS: Conus medullaris terminates at L1-2 and demonstrates normal morphology and signal characteristics. Cauda equina is normal. No  abnormal cord, leptomeningeal or epidural enhancement. PARASPINAL AND SOFT TISSUES: Included prevertebral and paraspinal soft tissues are normal. DISC LEVELS: L1-2 thru L3-4: No disc bulge, canal stenosis nor neural foraminal narrowing. L4-5: No disc bulge. Mild facet arthropathy and ligamentum flavum redundancy without canal stenosis. No neural foraminal narrowing. L5-S1: Annular bulging. Minimal facet arthropathy without canal stenosis. Minimal neural foraminal narrowing. IMPRESSION: MRI THORACIC SPINE: Motion degraded examination without convincing evidence of spinal infection or spinal cord pathology. No acute osseous process, no canal stenosis or neural foraminal narrowing. Patchy LEFT lower lobe suspected pneumonia. MRI LUMBAR SPINE: Motion degraded examination without convincing evidence of spinal infection or spinal cord pathology. No acute osseous process, no canal stenosis. Minimal L5-S1 neural foraminal narrowing. Electronically Signed   By: Elon Alas M.D.   On: 01/08/2017 18:58   Mr Lumbar Spine W Wo Contrast  Result Date: 01/08/2017 CLINICAL DATA:  Lower extremity weakness and leg weakness after injecting 1 g heroin last night. EXAM: MRI THORACIC AND LUMBAR SPINE WITHOUT AND WITH CONTRAST TECHNIQUE: Multiplanar and multiecho pulse sequences of the thoracic and lumbar spine were obtained without and with intravenous contrast. CONTRAST:  76m MULTIHANCE GADOBENATE DIMEGLUMINE 529 MG/ML IV SOLN COMPARISON:  None. FINDINGS: MRI THORACIC SPINE FINDINGS- multiple sequences are moderately or severely motion degraded. ALIGNMENT: Maintenance of the thoracic kyphosis. No malalignment. VERTEBRAE/DISCS: Vertebral bodies are intact. Intervertebral discs morphology and signal are normal. Scattered old Schmorl's node. No abnormal bone marrow signal or acute osseous process. No abnormal osseous or disc enhancement. Scattered old Schmorl's nodes. CORD: Thoracic spinal cord is normal morphology and signal  characteristics to the level of the conus medullaris which terminates at T12-L1. No abnormal cord or leptomeningeal enhancement. Linear dorsal epidural enhancement corresponding to flow voids on T2 weighted sequences ; limited evaluation due to severely motion degraded axial post gadolinium sequences. PREVERTEBRAL AND PARASPINAL SOFT TISSUES: Patchy airspace opacity LEFT lower lobe. DISC LEVELS: No significant disc bulge, canal stenosis or neural foraminal narrowing at any level. MRI LUMBAR SPINE FINDINGS SEGMENTATION: For the purposes of this report, the last well-formed intervertebral disc will be described as L5-S1. ALIGNMENT: Maintenance of the lumbar lordosis. No malalignment. VERTEBRAE:Vertebral bodies are intact. Intervertebral discs demonstrate normal morphology and signal characteristics. No abnormal bone marrow signal. No abnormal osseous or intradiscal enhancement. Scattered old Schmorl's nodes. CONUS MEDULLARIS: Conus medullaris terminates at L1-2 and demonstrates normal morphology and signal characteristics. Cauda equina is normal. No abnormal cord, leptomeningeal or epidural enhancement. PARASPINAL AND SOFT TISSUES: Included prevertebral and paraspinal soft tissues are normal. DISC LEVELS: L1-2 thru L3-4: No disc bulge, canal stenosis  nor neural foraminal narrowing. L4-5: No disc bulge. Mild facet arthropathy and ligamentum flavum redundancy without canal stenosis. No neural foraminal narrowing. L5-S1: Annular bulging. Minimal facet arthropathy without canal stenosis. Minimal neural foraminal narrowing. IMPRESSION: MRI THORACIC SPINE: Motion degraded examination without convincing evidence of spinal infection or spinal cord pathology. No acute osseous process, no canal stenosis or neural foraminal narrowing. Patchy LEFT lower lobe suspected pneumonia. MRI LUMBAR SPINE: Motion degraded examination without convincing evidence of spinal infection or spinal cord pathology. No acute osseous process, no  canal stenosis. Minimal L5-S1 neural foraminal narrowing. Electronically Signed   By: Elon Alas M.D.   On: 01/08/2017 18:58    Spencer Cerritos, DO  [facility] Pager [phone]  If 7PM-7AM, please contact night-coverage [email or web address] Password [email or web address] 01/09/2017, 12:38 PM   LOS: 0 days

## 2017-01-10 DIAGNOSIS — R29898 Other symptoms and signs involving the musculoskeletal system: Secondary | ICD-10-CM

## 2017-01-10 LAB — BASIC METABOLIC PANEL
Anion gap: 7 (ref 5–15)
BUN: 7 mg/dL (ref 6–20)
CO2: 23 mmol/L (ref 22–32)
CREATININE: 0.92 mg/dL (ref 0.61–1.24)
Calcium: 8.4 mg/dL — ABNORMAL LOW (ref 8.9–10.3)
Chloride: 112 mmol/L — ABNORMAL HIGH (ref 101–111)
GFR calc Af Amer: >60 mL/min (ref >60)
GFR calc non Af Amer: >60 mL/min (ref >60)
Glucose, Bld: 140 mg/dL — ABNORMAL HIGH (ref 65–99)
POTASSIUM: 3.6 mmol/L (ref 3.5–5.1)
Sodium: 142 mmol/L (ref 135–145)

## 2017-01-10 LAB — RPR: RPR Ser Ql: NONREACTIVE

## 2017-01-10 LAB — HEPATITIS PANEL, ACUTE
HCV Ab: <0.1 s/co ratio (ref 0.0–0.9)
Hep A IgM: NEGATIVE
Hep B C IgM: NEGATIVE
Hepatitis B Surface Ag: NEGATIVE

## 2017-01-10 LAB — CK: Total CK: 16381 U/L — ABNORMAL HIGH (ref 49–397)

## 2017-01-10 MED ORDER — LORAZEPAM 2 MG/ML IJ SOLN
1.0000 mg | INTRAMUSCULAR | Status: DC | PRN
  Administered 2017-01-10 – 2017-01-11 (×5): 1 mg via INTRAVENOUS
  Filled 2017-01-10 (×5): qty 1

## 2017-01-10 NOTE — Progress Notes (Signed)
Nutrition Brief Note  Patient identified with low BMI of 18.3 kg/m^2.  Pt with stable weight. Currently consuming 50-100% of meals. For dinner 4/11: ate 50% of hamburger, broccoli and 2 cookies (384 kcal, 15g protein). For breakfast this morning, ate 100% of ham and cheese omelet, boiled egg, and sausage (531 kcal, 35g protein).  Wt Readings from Last 15 Encounters:  01/08/17 131 lb (59.4 kg)  05/22/14 122 lb (55.3 kg)  05/18/14 135 lb (61.2 kg)    Body mass index is 18.27 kg/m. Patient meets criteria for underweight based on current BMI.   Current diet order is regular, patient is consuming approximately 50-100% of meals at this time. Labs and medications reviewed.   No nutrition interventions warranted at this time. If nutrition issues arise, please consult RD.   Spencer Franco, MS, RD, LDN Pager: [phone] After Hours Pager: [phone]

## 2017-01-10 NOTE — Progress Notes (Signed)
PROGRESS NOTE  Spencer Rogers ESP:[id number] DOB: 06-Nov-1995 DOA: 01/08/2017 PCP: No PCP Per Patient  Brief History:  21 y.o.malewith medical history significant for polysubstance abuse including IV drug abuse, and bipolar disorder who presents to the emergency department after being found down at home with his girlfriend. Patient had reportedly been injecting heroin and cocaine throughout the night last night and into this morning. He reports injecting 1 g of heroin overnight with his last use at approximately 10 AM on the day of presentation. Someone called EMS and the patient and his girlfriend were both found lying on the floor and difficult to arouse. He was brought into the ED for further evaluation. Patient denies fevers or chills and denies abdominal pain, nausea, vomiting, or diarrhea. He also denies chest pain or palpitations. He notes bilateral calf pain and weakness. He denies headache, change in vision or hearing, or loss of correlation. Patient reports that he had been clean for 90 days before relapsing 01/07/17.  Serum CK is elevated to a value of 11,609. Patient was given 3 L of normal saline in the emergency department and treated with azithromycin and Rocephin. He has remained somnolent, but easily roused. BP remains soft but stable and tachycardia has nearly resolved. He will be observed on the medical-surgical unit for ongoing evaluation and management of rhabdomyolysis which is likely secondary to cocaine and opiate abuse.   Assessment/Plan: 1. Rhabdomyolysis  - Pt presents after heavy use of IV heroin and cocaine - Noted to have BLE weakness with CK of 11,600 and mild renal insufficiency; thoracic and lumbar spine MRI obtained and essentially negative  - continue IVF -CK [id number]>>[id number]>>[id number] -CK finally starting to trend down  2. Opiate overdose, IVDU, polysubstance abuse  - Pt presents after heavy use of IV heroin and cocaine - Social work consultation is requested   - wean IV lorazepam--no signs of withdrawl  3. Pneumonia  - Rhonchi and cough noted; T-spine MRI reveals patchy LLL consolidation  - Likely secondary to aspiration given the history  - continue empiric abx with Unasyn -->plan to switch to amox/clav in 24 hours  4. CKD stage II - SCr is 1.51 on admission - baseline creatinine 1.1-1.3 - May be an acute component in setting of rhabdomyolysis  - continue IVF   5. Elevated transaminases - likely due to rhabdo - AST 169 and ALT 70 on admission  - No RUQ tenderness; bilirubin and alk phos wnl   - Hepatitis A,B,C serology--negative - HIV--pending    Disposition Plan:   Home in 2-3 days  Family Communication:   No Family at bedside  Consultants: none   Code Status:  FULL  DVT Prophylaxis:   [place] Lovenox   Procedures: As Listed in Progress Note Above  Antibiotics: None   Subjective: Patient complains of having restless legs and pain all over. He feels that the Ativan is helping with his anxiety and restless leg. Denies any chest pain, shortness breath, nausea, vomiting, diarrhea, abdominal pain. No dysuria or hematuria.  Objective: Vitals:   01/09/17 2135 01/10/17 0421 01/10/17 0900 01/10/17 1315  BP: 133/84 (!) 123/93 129/73 128/88  Pulse: 88 85 77 62  Resp: '17 18 17 17  '$ Temp: 98.3 F (36.8 C) 98 F (36.7 C) 98 F (36.7 C) 97.8 F (36.6 C)  TempSrc: Oral Oral Axillary Oral  SpO2: 99% 99% 100% 100%  Weight:      Height:  Intake/Output Summary (Last 24 hours) at 01/10/17 1540 Last data filed at 01/10/17 1300  Gross per 24 hour  Intake          3541.67 ml  Output              675 ml  Net          2866.67 ml   Weight change:  Exam:   General:  Pt is alert, follows commands appropriately, not in acute distress  HEENT: No icterus, No thrush, No neck mass, [place]/AT  Cardiovascular: RRR, S1/S2, no rubs, no gallops  Respiratory: CTA bilaterally, no wheezing, no crackles, no rhonchi  Abdomen:  Soft/+BS, non tender, non distended, no guarding  Extremities: No edema, No lymphangitis, No petechiae, No rashes, no synovitis   Data Reviewed: I have personally reviewed following labs and imaging studies Basic Metabolic Panel:  Recent Labs Lab 01/08/17 1303 01/09/17 0510 01/10/17 0509  NA 143 141 142  K 4.2 3.4* 3.6  CL 112* 111 112*  CO2 '27 24 23  '$ GLUCOSE 95 123* 140*  BUN 21* 14 7  CREATININE 1.51* 1.18 0.92  CALCIUM 8.2* 8.2* 8.4*   Liver Function Tests:  Recent Labs Lab 01/08/17 1303 01/09/17 0510  AST 169* 323*  ALT 70* 95*  ALKPHOS 66 51  BILITOT 0.5 0.4  PROT 6.2* 5.1*  ALBUMIN 4.0 3.2*   No results for input(s): LIPASE, AMYLASE in the last 168 hours. No results for input(s): AMMONIA in the last 168 hours. Coagulation Profile: No results for input(s): INR, PROTIME in the last 168 hours. CBC:  Recent Labs Lab 01/08/17 1303 01/09/17 0510  WBC 14.8* 9.6  NEUTROABS  --  6.6  HGB 14.4 11.9*  HCT 42.3 35.0*  MCV 91.6 94.6  PLT 168 151   Cardiac Enzymes:  Recent Labs Lab 01/08/17 1548 01/09/17 0510 01/10/17 0509  CKTOTAL 11,609* 16,513* 16,381*   BNP: Invalid input(s): POCBNP CBG: No results for input(s): GLUCAP in the last 168 hours. HbA1C: No results for input(s): HGBA1C in the last 72 hours. Urine analysis:    Component Value Date/Time   COLORURINE YELLOW 01/09/2017 0159   APPEARANCEUR CLOUDY (A) 01/09/2017 0159   LABSPEC 1.026 01/09/2017 0159   PHURINE 5.0 01/09/2017 0159   GLUCOSEU NEGATIVE 01/09/2017 0159   HGBUR SMALL (A) 01/09/2017 0159   BILIRUBINUR NEGATIVE 01/09/2017 0159   KETONESUR NEGATIVE 01/09/2017 0159   PROTEINUR 30 (A) 01/09/2017 0159   UROBILINOGEN 1.0 03/06/2015 1138   NITRITE NEGATIVE 01/09/2017 0159   LEUKOCYTESUR LARGE (A) 01/09/2017 0159   Sepsis Labs: '@LABRCNTIP'$ (procalcitonin:4,lacticidven:4) )No results found for this or any previous visit (from the past 240 hour(s)).   Scheduled Meds: .  ampicillin-sulbactam (UNASYN) IV  3 g Intravenous Q6H  . enoxaparin (LOVENOX) injection  40 mg Subcutaneous Q24H  . potassium chloride  20 mEq Oral Once   Continuous Infusions: . 0.9 % NaCl with KCl 20 mEq / L 125 mL/hr at 01/10/17 1129    Procedures/Studies: Mr Thoracic Spine W Wo Contrast  Result Date: 01/08/2017 CLINICAL DATA:  Lower extremity weakness and leg weakness after injecting 1 g heroin last night. EXAM: MRI THORACIC AND LUMBAR SPINE WITHOUT AND WITH CONTRAST TECHNIQUE: Multiplanar and multiecho pulse sequences of the thoracic and lumbar spine were obtained without and with intravenous contrast. CONTRAST:  61m MULTIHANCE GADOBENATE DIMEGLUMINE 529 MG/ML IV SOLN COMPARISON:  None. FINDINGS: MRI THORACIC SPINE FINDINGS- multiple sequences are moderately or severely motion degraded. ALIGNMENT: Maintenance of the thoracic kyphosis. No  malalignment. VERTEBRAE/DISCS: Vertebral bodies are intact. Intervertebral discs morphology and signal are normal. Scattered old Schmorl's node. No abnormal bone marrow signal or acute osseous process. No abnormal osseous or disc enhancement. Scattered old Schmorl's nodes. CORD: Thoracic spinal cord is normal morphology and signal characteristics to the level of the conus medullaris which terminates at T12-L1. No abnormal cord or leptomeningeal enhancement. Linear dorsal epidural enhancement corresponding to flow voids on T2 weighted sequences ; limited evaluation due to severely motion degraded axial post gadolinium sequences. PREVERTEBRAL AND PARASPINAL SOFT TISSUES: Patchy airspace opacity LEFT lower lobe. DISC LEVELS: No significant disc bulge, canal stenosis or neural foraminal narrowing at any level. MRI LUMBAR SPINE FINDINGS SEGMENTATION: For the purposes of this report, the last well-formed intervertebral disc will be described as L5-S1. ALIGNMENT: Maintenance of the lumbar lordosis. No malalignment. VERTEBRAE:Vertebral bodies are intact. Intervertebral  discs demonstrate normal morphology and signal characteristics. No abnormal bone marrow signal. No abnormal osseous or intradiscal enhancement. Scattered old Schmorl's nodes. CONUS MEDULLARIS: Conus medullaris terminates at L1-2 and demonstrates normal morphology and signal characteristics. Cauda equina is normal. No abnormal cord, leptomeningeal or epidural enhancement. PARASPINAL AND SOFT TISSUES: Included prevertebral and paraspinal soft tissues are normal. DISC LEVELS: L1-2 thru L3-4: No disc bulge, canal stenosis nor neural foraminal narrowing. L4-5: No disc bulge. Mild facet arthropathy and ligamentum flavum redundancy without canal stenosis. No neural foraminal narrowing. L5-S1: Annular bulging. Minimal facet arthropathy without canal stenosis. Minimal neural foraminal narrowing. IMPRESSION: MRI THORACIC SPINE: Motion degraded examination without convincing evidence of spinal infection or spinal cord pathology. No acute osseous process, no canal stenosis or neural foraminal narrowing. Patchy LEFT lower lobe suspected pneumonia. MRI LUMBAR SPINE: Motion degraded examination without convincing evidence of spinal infection or spinal cord pathology. No acute osseous process, no canal stenosis. Minimal L5-S1 neural foraminal narrowing. Electronically Signed   By: Elon Alas M.D.   On: 01/08/2017 18:58   Mr Lumbar Spine W Wo Contrast  Result Date: 01/08/2017 CLINICAL DATA:  Lower extremity weakness and leg weakness after injecting 1 g heroin last night. EXAM: MRI THORACIC AND LUMBAR SPINE WITHOUT AND WITH CONTRAST TECHNIQUE: Multiplanar and multiecho pulse sequences of the thoracic and lumbar spine were obtained without and with intravenous contrast. CONTRAST:  70m MULTIHANCE GADOBENATE DIMEGLUMINE 529 MG/ML IV SOLN COMPARISON:  None. FINDINGS: MRI THORACIC SPINE FINDINGS- multiple sequences are moderately or severely motion degraded. ALIGNMENT: Maintenance of the thoracic kyphosis. No malalignment.  VERTEBRAE/DISCS: Vertebral bodies are intact. Intervertebral discs morphology and signal are normal. Scattered old Schmorl's node. No abnormal bone marrow signal or acute osseous process. No abnormal osseous or disc enhancement. Scattered old Schmorl's nodes. CORD: Thoracic spinal cord is normal morphology and signal characteristics to the level of the conus medullaris which terminates at T12-L1. No abnormal cord or leptomeningeal enhancement. Linear dorsal epidural enhancement corresponding to flow voids on T2 weighted sequences ; limited evaluation due to severely motion degraded axial post gadolinium sequences. PREVERTEBRAL AND PARASPINAL SOFT TISSUES: Patchy airspace opacity LEFT lower lobe. DISC LEVELS: No significant disc bulge, canal stenosis or neural foraminal narrowing at any level. MRI LUMBAR SPINE FINDINGS SEGMENTATION: For the purposes of this report, the last well-formed intervertebral disc will be described as L5-S1. ALIGNMENT: Maintenance of the lumbar lordosis. No malalignment. VERTEBRAE:Vertebral bodies are intact. Intervertebral discs demonstrate normal morphology and signal characteristics. No abnormal bone marrow signal. No abnormal osseous or intradiscal enhancement. Scattered old Schmorl's nodes. CONUS MEDULLARIS: Conus medullaris terminates at L1-2 and demonstrates normal morphology and  signal characteristics. Cauda equina is normal. No abnormal cord, leptomeningeal or epidural enhancement. PARASPINAL AND SOFT TISSUES: Included prevertebral and paraspinal soft tissues are normal. DISC LEVELS: L1-2 thru L3-4: No disc bulge, canal stenosis nor neural foraminal narrowing. L4-5: No disc bulge. Mild facet arthropathy and ligamentum flavum redundancy without canal stenosis. No neural foraminal narrowing. L5-S1: Annular bulging. Minimal facet arthropathy without canal stenosis. Minimal neural foraminal narrowing. IMPRESSION: MRI THORACIC SPINE: Motion degraded examination without convincing evidence  of spinal infection or spinal cord pathology. No acute osseous process, no canal stenosis or neural foraminal narrowing. Patchy LEFT lower lobe suspected pneumonia. MRI LUMBAR SPINE: Motion degraded examination without convincing evidence of spinal infection or spinal cord pathology. No acute osseous process, no canal stenosis. Minimal L5-S1 neural foraminal narrowing. Electronically Signed   By: Elon Alas M.D.   On: 01/08/2017 18:58    Nazire Fruth, DO  [facility] Pager [phone]  If 7PM-7AM, please contact night-coverage [email or web address] Password [email or web address] 01/10/2017, 3:40 PM   LOS: 1 day

## 2017-01-11 LAB — CBC
HEMATOCRIT: 33.7 % — AB (ref 39.0–52.0)
HEMOGLOBIN: 11.7 g/dL — AB (ref 13.0–17.0)
MCH: 31.9 pg (ref 26.0–34.0)
MCHC: 34.7 g/dL (ref 30.0–36.0)
MCV: 91.8 fL (ref 78.0–100.0)
Platelets: 135 10*3/uL — ABNORMAL LOW (ref 150–400)
RBC: 3.67 MIL/uL — ABNORMAL LOW (ref 4.22–5.81)
RDW: 12.4 % (ref 11.5–15.5)
WBC: 7.7 10*3/uL (ref 4.0–10.5)

## 2017-01-11 LAB — COMPREHENSIVE METABOLIC PANEL
ALBUMIN: 3.3 g/dL — AB (ref 3.5–5.0)
ALK PHOS: 46 U/L (ref 38–126)
ALT: 122 U/L — ABNORMAL HIGH (ref 17–63)
AST: 300 U/L — AB (ref 15–41)
Anion gap: 6 (ref 5–15)
CO2: 24 mmol/L (ref 22–32)
Calcium: 8.6 mg/dL — ABNORMAL LOW (ref 8.9–10.3)
Chloride: 115 mmol/L — ABNORMAL HIGH (ref 101–111)
Creatinine, Ser: 0.98 mg/dL (ref 0.61–1.24)
GFR calc Af Amer: >60 mL/min (ref >60)
GFR calc non Af Amer: >60 mL/min (ref >60)
GLUCOSE: 97 mg/dL (ref 65–99)
POTASSIUM: 3.7 mmol/L (ref 3.5–5.1)
Sodium: 145 mmol/L (ref 135–145)
Total Bilirubin: 0.6 mg/dL (ref 0.3–1.2)
Total Protein: 5.3 g/dL — ABNORMAL LOW (ref 6.5–8.1)

## 2017-01-11 LAB — CK: CK TOTAL: 9415 U/L — AB (ref 49–397)

## 2017-01-11 LAB — HIV ANTIBODY (ROUTINE TESTING W REFLEX): HIV Screen 4th Generation wRfx: NONREACTIVE

## 2017-01-11 MED ORDER — AMOXICILLIN-POT CLAVULANATE 875-125 MG PO TABS: 1.0000 | ORAL_TABLET | Freq: Two times a day (BID) | ORAL | 0 refills | Status: DC

## 2017-01-11 MED ORDER — AMOXICILLIN-POT CLAVULANATE 875-125 MG PO TABS: 1.0000 | ORAL_TABLET | Freq: Two times a day (BID) | ORAL | Status: DC

## 2017-01-11 NOTE — Discharge Summary (Signed)
Physician Discharge Summary  Spencer Rogers DPO:[id number] DOB: [birth year] DOA: 01/08/2017  PCP: No PCP Per Patient  Admit date: 01/08/2017 Discharge date: 01/11/2017  Admitted From: Home Disposition:  Leaving AMA -I discussed the risks/benefits of leaving AMA including but not limited to worsening muscle pain and weakness, renal failure, death.  Pt expressed understanding      Discharge Condition: Leaving AMA CODE STATUS: FULL Diet recommendation: Heart Healthy    Brief/Interim Summary: 21 y.o.malewith medical history significant for polysubstance abuse including IV drug abuse, and bipolar disorder who presents to the emergency department after being found down at home with his girlfriend. Patient had reportedly been injecting heroin and cocaine throughout the night last night and into this morning. He reports injecting 1 g of heroin overnight with his last use at approximately 10 AM on the day of presentation. Someone called EMS and the patient and his girlfriend were both found lying on the floor and difficult to arouse. He was brought into the ED for further evaluation. Patient denies fevers or chills and denies abdominal pain, nausea, vomiting, or diarrhea. He also denies chest pain or palpitations. He notes bilateral calf pain and weakness. He denies headache, change in vision or hearing, or loss of correlation. Patient reports that he had been clean for 90 daysbefore relapsing 01/07/17. Serum CK is elevated to a value of 11,609. Patient was given 3 L of normal saline in the emergency department and treated with azithromycin and Rocephin. He has remained somnolent, but easily roused. BP remains soft but stable and tachycardia has nearly resolved. He will be observed on the medical-surgical unit for ongoing evaluation and management of rhabdomyolysis which is likely secondary to cocaine and opiate abuse. Throughout the hospitalization, the patient complained of "pain all over". The patient  became increasingly angry that he was not prescribed opiates. He wanted to be prescribed Suboxone. The patient subsequently left AGAINST MEDICAL ADVICE.  Discharge Diagnoses:  1. Rhabdomyolysis  - Pt presents after heavy use of IV heroin and cocaine - Noted to have BLE weakness with CK of 11,600 and mild renal insufficiency; thoracic and lumbar spine MRI obtained and essentially negative  - continue IVF -CK [id number]>>[id number]  2. Opiate overdose, IVDU, polysubstance abuse  - Pt presents after heavy use of IV heroin and cocaine - Social work consultation is requested  - wean IV lorazepam--no signs of withdrawal - pt angry I will not prescribe him opiates or suboxone-->leaving AMA  3. Pneumonia  - Rhonchi and cough noted; T-spine MRI reveals patchy LLL consolidation  - Likely secondary to aspiration given the history  - continue empiric abx with Unasyn -->home with amox/clav x 3 more days  4. CKD stage II - SCr is 1.51 on admission - baseline creatinine 1.1-1.3 - May be an acute component in setting of rhabdomyolysis  - continue IVF   5. Elevated transaminases - likely due to rhabdo - AST 169 and ALT 70 on admission  - No RUQ tenderness; bilirubin and alk phos wnl  - Hepatitis A,B,C serology--negative - HIV--negative    Discharge Instructions   Allergies as of 01/11/2017   No Known Allergies     Medication List    STOP taking these medications   dicyclomine 20 MG tablet Commonly known as:  BENTYL   ondansetron 4 MG tablet Commonly known as:  ZOFRAN     TAKE these medications   amoxicillin-clavulanate 875-125 MG tablet Commonly known as:  AUGMENTIN Take 1 tablet by mouth every 12 (twelve)  hours.   clonazePAM 1 MG tablet Commonly known as:  KLONOPIN Take 1 mg by mouth once.   hydrOXYzine 25 MG tablet Commonly known as:  ATARAX/VISTARIL Take 1 tablet (25 mg) three times daily as needed: For anxiety   OVER THE COUNTER MEDICATION Kratom herb  from local smoke shop to help with opiod abuse  Takes 12 capsules in the morning and 2-3 capsules every 15 minutes throughout the day   traZODone 50 MG tablet Commonly known as:  DESYREL Take 1 tablet (50 mg total) by mouth at bedtime as needed for sleep.       No Known Allergies  Consultations:  none   Procedures/Studies: Mr Thoracic Spine W Wo Contrast  Result Date: 01/08/2017 CLINICAL DATA:  Lower extremity weakness and leg weakness after injecting 1 g heroin last night. EXAM: MRI THORACIC AND LUMBAR SPINE WITHOUT AND WITH CONTRAST TECHNIQUE: Multiplanar and multiecho pulse sequences of the thoracic and lumbar spine were obtained without and with intravenous contrast. CONTRAST:  57m MULTIHANCE GADOBENATE DIMEGLUMINE 529 MG/ML IV SOLN COMPARISON:  None. FINDINGS: MRI THORACIC SPINE FINDINGS- multiple sequences are moderately or severely motion degraded. ALIGNMENT: Maintenance of the thoracic kyphosis. No malalignment. VERTEBRAE/DISCS: Vertebral bodies are intact. Intervertebral discs morphology and signal are normal. Scattered old Schmorl's node. No abnormal bone marrow signal or acute osseous process. No abnormal osseous or disc enhancement. Scattered old Schmorl's nodes. CORD: Thoracic spinal cord is normal morphology and signal characteristics to the level of the conus medullaris which terminates at T12-L1. No abnormal cord or leptomeningeal enhancement. Linear dorsal epidural enhancement corresponding to flow voids on T2 weighted sequences ; limited evaluation due to severely motion degraded axial post gadolinium sequences. PREVERTEBRAL AND PARASPINAL SOFT TISSUES: Patchy airspace opacity LEFT lower lobe. DISC LEVELS: No significant disc bulge, canal stenosis or neural foraminal narrowing at any level. MRI LUMBAR SPINE FINDINGS SEGMENTATION: For the purposes of this report, the last well-formed intervertebral disc will be described as L5-S1. ALIGNMENT: Maintenance of the lumbar lordosis. No  malalignment. VERTEBRAE:Vertebral bodies are intact. Intervertebral discs demonstrate normal morphology and signal characteristics. No abnormal bone marrow signal. No abnormal osseous or intradiscal enhancement. Scattered old Schmorl's nodes. CONUS MEDULLARIS: Conus medullaris terminates at L1-2 and demonstrates normal morphology and signal characteristics. Cauda equina is normal. No abnormal cord, leptomeningeal or epidural enhancement. PARASPINAL AND SOFT TISSUES: Included prevertebral and paraspinal soft tissues are normal. DISC LEVELS: L1-2 thru L3-4: No disc bulge, canal stenosis nor neural foraminal narrowing. L4-5: No disc bulge. Mild facet arthropathy and ligamentum flavum redundancy without canal stenosis. No neural foraminal narrowing. L5-S1: Annular bulging. Minimal facet arthropathy without canal stenosis. Minimal neural foraminal narrowing. IMPRESSION: MRI THORACIC SPINE: Motion degraded examination without convincing evidence of spinal infection or spinal cord pathology. No acute osseous process, no canal stenosis or neural foraminal narrowing. Patchy LEFT lower lobe suspected pneumonia. MRI LUMBAR SPINE: Motion degraded examination without convincing evidence of spinal infection or spinal cord pathology. No acute osseous process, no canal stenosis. Minimal L5-S1 neural foraminal narrowing. Electronically Signed   By: CElon AlasM.D.   On: 01/08/2017 18:58   Mr Lumbar Spine W Wo Contrast  Result Date: 01/08/2017 CLINICAL DATA:  Lower extremity weakness and leg weakness after injecting 1 g heroin last night. EXAM: MRI THORACIC AND LUMBAR SPINE WITHOUT AND WITH CONTRAST TECHNIQUE: Multiplanar and multiecho pulse sequences of the thoracic and lumbar spine were obtained without and with intravenous contrast. CONTRAST:  115mMULTIHANCE GADOBENATE DIMEGLUMINE 529 MG/ML IV SOLN COMPARISON:  None. FINDINGS: MRI THORACIC SPINE FINDINGS- multiple sequences are moderately or severely motion degraded.  ALIGNMENT: Maintenance of the thoracic kyphosis. No malalignment. VERTEBRAE/DISCS: Vertebral bodies are intact. Intervertebral discs morphology and signal are normal. Scattered old Schmorl's node. No abnormal bone marrow signal or acute osseous process. No abnormal osseous or disc enhancement. Scattered old Schmorl's nodes. CORD: Thoracic spinal cord is normal morphology and signal characteristics to the level of the conus medullaris which terminates at T12-L1. No abnormal cord or leptomeningeal enhancement. Linear dorsal epidural enhancement corresponding to flow voids on T2 weighted sequences ; limited evaluation due to severely motion degraded axial post gadolinium sequences. PREVERTEBRAL AND PARASPINAL SOFT TISSUES: Patchy airspace opacity LEFT lower lobe. DISC LEVELS: No significant disc bulge, canal stenosis or neural foraminal narrowing at any level. MRI LUMBAR SPINE FINDINGS SEGMENTATION: For the purposes of this report, the last well-formed intervertebral disc will be described as L5-S1. ALIGNMENT: Maintenance of the lumbar lordosis. No malalignment. VERTEBRAE:Vertebral bodies are intact. Intervertebral discs demonstrate normal morphology and signal characteristics. No abnormal bone marrow signal. No abnormal osseous or intradiscal enhancement. Scattered old Schmorl's nodes. CONUS MEDULLARIS: Conus medullaris terminates at L1-2 and demonstrates normal morphology and signal characteristics. Cauda equina is normal. No abnormal cord, leptomeningeal or epidural enhancement. PARASPINAL AND SOFT TISSUES: Included prevertebral and paraspinal soft tissues are normal. DISC LEVELS: L1-2 thru L3-4: No disc bulge, canal stenosis nor neural foraminal narrowing. L4-5: No disc bulge. Mild facet arthropathy and ligamentum flavum redundancy without canal stenosis. No neural foraminal narrowing. L5-S1: Annular bulging. Minimal facet arthropathy without canal stenosis. Minimal neural foraminal narrowing. IMPRESSION: MRI  THORACIC SPINE: Motion degraded examination without convincing evidence of spinal infection or spinal cord pathology. No acute osseous process, no canal stenosis or neural foraminal narrowing. Patchy LEFT lower lobe suspected pneumonia. MRI LUMBAR SPINE: Motion degraded examination without convincing evidence of spinal infection or spinal cord pathology. No acute osseous process, no canal stenosis. Minimal L5-S1 neural foraminal narrowing. Electronically Signed   By: Elon Alas M.D.   On: 01/08/2017 18:58        Discharge Exam: Vitals:   01/10/17 2043 01/11/17 0434  BP: 119/77 127/86  Pulse: 73 67  Resp: 18 18  Temp: 98.5 F (36.9 C) 98.7 F (37.1 C)   Vitals:   01/10/17 0900 01/10/17 1315 01/10/17 2043 01/11/17 0434  BP: 129/73 128/88 119/77 127/86  Pulse: 77 62 73 67  Resp: '17 17 18 18  '$ Temp: 98 F (36.7 C) 97.8 F (36.6 C) 98.5 F (36.9 C) 98.7 F (37.1 C)  TempSrc: Axillary Oral Oral Oral  SpO2: 100% 100% 100% 99%  Weight:      Height:        General: Pt is alert, awake, not in acute distress Cardiovascular: RRR, S1/S2 +, no rubs, no gallops Respiratory: CTA bilaterally, no wheezing, no rhonchi Abdominal: Soft, NT, ND, bowel sounds + Extremities: no edema, no cyanosis   The results of significant diagnostics from this hospitalization (including imaging, microbiology, ancillary and laboratory) are listed below for reference.    Significant Diagnostic Studies: Mr Thoracic Spine W Wo Contrast  Result Date: 01/08/2017 CLINICAL DATA:  Lower extremity weakness and leg weakness after injecting 1 g heroin last night. EXAM: MRI THORACIC AND LUMBAR SPINE WITHOUT AND WITH CONTRAST TECHNIQUE: Multiplanar and multiecho pulse sequences of the thoracic and lumbar spine were obtained without and with intravenous contrast. CONTRAST:  39m MULTIHANCE GADOBENATE DIMEGLUMINE 529 MG/ML IV SOLN COMPARISON:  None. FINDINGS: MRI THORACIC SPINE FINDINGS-  multiple sequences are  moderately or severely motion degraded. ALIGNMENT: Maintenance of the thoracic kyphosis. No malalignment. VERTEBRAE/DISCS: Vertebral bodies are intact. Intervertebral discs morphology and signal are normal. Scattered old Schmorl's node. No abnormal bone marrow signal or acute osseous process. No abnormal osseous or disc enhancement. Scattered old Schmorl's nodes. CORD: Thoracic spinal cord is normal morphology and signal characteristics to the level of the conus medullaris which terminates at T12-L1. No abnormal cord or leptomeningeal enhancement. Linear dorsal epidural enhancement corresponding to flow voids on T2 weighted sequences ; limited evaluation due to severely motion degraded axial post gadolinium sequences. PREVERTEBRAL AND PARASPINAL SOFT TISSUES: Patchy airspace opacity LEFT lower lobe. DISC LEVELS: No significant disc bulge, canal stenosis or neural foraminal narrowing at any level. MRI LUMBAR SPINE FINDINGS SEGMENTATION: For the purposes of this report, the last well-formed intervertebral disc will be described as L5-S1. ALIGNMENT: Maintenance of the lumbar lordosis. No malalignment. VERTEBRAE:Vertebral bodies are intact. Intervertebral discs demonstrate normal morphology and signal characteristics. No abnormal bone marrow signal. No abnormal osseous or intradiscal enhancement. Scattered old Schmorl's nodes. CONUS MEDULLARIS: Conus medullaris terminates at L1-2 and demonstrates normal morphology and signal characteristics. Cauda equina is normal. No abnormal cord, leptomeningeal or epidural enhancement. PARASPINAL AND SOFT TISSUES: Included prevertebral and paraspinal soft tissues are normal. DISC LEVELS: L1-2 thru L3-4: No disc bulge, canal stenosis nor neural foraminal narrowing. L4-5: No disc bulge. Mild facet arthropathy and ligamentum flavum redundancy without canal stenosis. No neural foraminal narrowing. L5-S1: Annular bulging. Minimal facet arthropathy without canal stenosis. Minimal neural  foraminal narrowing. IMPRESSION: MRI THORACIC SPINE: Motion degraded examination without convincing evidence of spinal infection or spinal cord pathology. No acute osseous process, no canal stenosis or neural foraminal narrowing. Patchy LEFT lower lobe suspected pneumonia. MRI LUMBAR SPINE: Motion degraded examination without convincing evidence of spinal infection or spinal cord pathology. No acute osseous process, no canal stenosis. Minimal L5-S1 neural foraminal narrowing. Electronically Signed   By: Elon Alas M.D.   On: 01/08/2017 18:58   Mr Lumbar Spine W Wo Contrast  Result Date: 01/08/2017 CLINICAL DATA:  Lower extremity weakness and leg weakness after injecting 1 g heroin last night. EXAM: MRI THORACIC AND LUMBAR SPINE WITHOUT AND WITH CONTRAST TECHNIQUE: Multiplanar and multiecho pulse sequences of the thoracic and lumbar spine were obtained without and with intravenous contrast. CONTRAST:  75m MULTIHANCE GADOBENATE DIMEGLUMINE 529 MG/ML IV SOLN COMPARISON:  None. FINDINGS: MRI THORACIC SPINE FINDINGS- multiple sequences are moderately or severely motion degraded. ALIGNMENT: Maintenance of the thoracic kyphosis. No malalignment. VERTEBRAE/DISCS: Vertebral bodies are intact. Intervertebral discs morphology and signal are normal. Scattered old Schmorl's node. No abnormal bone marrow signal or acute osseous process. No abnormal osseous or disc enhancement. Scattered old Schmorl's nodes. CORD: Thoracic spinal cord is normal morphology and signal characteristics to the level of the conus medullaris which terminates at T12-L1. No abnormal cord or leptomeningeal enhancement. Linear dorsal epidural enhancement corresponding to flow voids on T2 weighted sequences ; limited evaluation due to severely motion degraded axial post gadolinium sequences. PREVERTEBRAL AND PARASPINAL SOFT TISSUES: Patchy airspace opacity LEFT lower lobe. DISC LEVELS: No significant disc bulge, canal stenosis or neural foraminal  narrowing at any level. MRI LUMBAR SPINE FINDINGS SEGMENTATION: For the purposes of this report, the last well-formed intervertebral disc will be described as L5-S1. ALIGNMENT: Maintenance of the lumbar lordosis. No malalignment. VERTEBRAE:Vertebral bodies are intact. Intervertebral discs demonstrate normal morphology and signal characteristics. No abnormal bone marrow signal. No abnormal osseous or intradiscal enhancement.  Scattered old Schmorl's nodes. CONUS MEDULLARIS: Conus medullaris terminates at L1-2 and demonstrates normal morphology and signal characteristics. Cauda equina is normal. No abnormal cord, leptomeningeal or epidural enhancement. PARASPINAL AND SOFT TISSUES: Included prevertebral and paraspinal soft tissues are normal. DISC LEVELS: L1-2 thru L3-4: No disc bulge, canal stenosis nor neural foraminal narrowing. L4-5: No disc bulge. Mild facet arthropathy and ligamentum flavum redundancy without canal stenosis. No neural foraminal narrowing. L5-S1: Annular bulging. Minimal facet arthropathy without canal stenosis. Minimal neural foraminal narrowing. IMPRESSION: MRI THORACIC SPINE: Motion degraded examination without convincing evidence of spinal infection or spinal cord pathology. No acute osseous process, no canal stenosis or neural foraminal narrowing. Patchy LEFT lower lobe suspected pneumonia. MRI LUMBAR SPINE: Motion degraded examination without convincing evidence of spinal infection or spinal cord pathology. No acute osseous process, no canal stenosis. Minimal L5-S1 neural foraminal narrowing. Electronically Signed   By: Elon Alas M.D.   On: 01/08/2017 18:58     Microbiology: No results found for this or any previous visit (from the past 240 hour(s)).   Labs: Basic Metabolic Panel:  Recent Labs Lab 01/08/17 1303 01/09/17 0510 01/10/17 0509 01/11/17 0457  NA 143 141 142 145  K 4.2 3.4* 3.6 3.7  CL 112* 111 112* 115*  CO2 '27 24 23 24  '$ GLUCOSE 95 123* 140* 97  BUN 21*  14 7 <5*  CREATININE 1.51* 1.18 0.92 0.98  CALCIUM 8.2* 8.2* 8.4* 8.6*   Liver Function Tests:  Recent Labs Lab 01/08/17 1303 01/09/17 0510 01/11/17 0457  AST 169* 323* 300*  ALT 70* 95* 122*  ALKPHOS 66 51 46  BILITOT 0.5 0.4 0.6  PROT 6.2* 5.1* 5.3*  ALBUMIN 4.0 3.2* 3.3*   No results for input(s): LIPASE, AMYLASE in the last 168 hours. No results for input(s): AMMONIA in the last 168 hours. CBC:  Recent Labs Lab 01/08/17 1303 01/09/17 0510 01/11/17 0457  WBC 14.8* 9.6 7.7  NEUTROABS  --  6.6  --   HGB 14.4 11.9* 11.7*  HCT 42.3 35.0* 33.7*  MCV 91.6 94.6 91.8  PLT 168 151 135*   Cardiac Enzymes:  Recent Labs Lab 01/08/17 1548 01/09/17 0510 01/10/17 0509 01/11/17 0457  CKTOTAL 11,609* 16,513* 16,381* 9,415*   BNP: Invalid input(s): POCBNP CBG: No results for input(s): GLUCAP in the last 168 hours.  Time coordinating discharge:  Greater than 30 minutes  Signed:  Vicci Reder, DO [facility] Pager: [phone] 01/11/2017, 12:59 PM

## 2017-01-11 NOTE — Progress Notes (Signed)
Throughout the day, pt grew increasingly restless and agitated asking for pain medication.  Social Work talked with him and gave him resources for his discharge.  After their discussion, he decided to leave AMA and the MD was notified. The AMA form was signed and the patient was escorted to the entrance.

## 2017-01-11 NOTE — Progress Notes (Signed)
CSW met with patient at bedside, explain role. Patient agreeable to talk. Patient express he was frustrated, ready to leave the hospital because he was not getting enough pain medication.  Patient reported he wanted suboxane or methadone while in hospital but was told he could not have them. CSW provided support and actively listened to patient concerns. Patient decided he want to leave AMA. CSW provided number for the [facility] and resources to suboxane and methadone clinic.Patient reports he has access to money and plans to get hotel room. Patient reports no other needs at this time.   Kathrin Greathouse, Latanya Presser, MSW Clinical Social Worker 5E and Psychiatric Service Line [phone] 01/11/2017  1:01 PM

## 2017-01-11 NOTE — Progress Notes (Signed)
Patient left AMA, notes he has financial resources to pay for meds. [phone]

## 2017-06-16 ENCOUNTER — Emergency Department (HOSPITAL_COMMUNITY)
Admission: EM | Admit: 2017-06-16 | Discharge: 2017-06-16 | Disposition: A | Discharge status: Home / Self Care | Payer: Self-pay | Attending: Emergency Medicine | Admitting: Emergency Medicine

## 2017-06-16 ENCOUNTER — Encounter (HOSPITAL_COMMUNITY): Payer: Self-pay | Admitting: Emergency Medicine

## 2017-06-16 DIAGNOSIS — H6123 Impacted cerumen, bilateral: Secondary | ICD-10-CM | POA: Insufficient documentation

## 2017-06-16 DIAGNOSIS — Z760 Encounter for issue of repeat prescription: Secondary | ICD-10-CM | POA: Insufficient documentation

## 2017-06-16 DIAGNOSIS — Z79899 Other long term (current) drug therapy: Secondary | ICD-10-CM | POA: Insufficient documentation

## 2017-06-16 DIAGNOSIS — F17210 Nicotine dependence, cigarettes, uncomplicated: Secondary | ICD-10-CM | POA: Insufficient documentation

## 2017-06-16 DIAGNOSIS — N182 Chronic kidney disease, stage 2 (mild): Secondary | ICD-10-CM | POA: Insufficient documentation

## 2017-06-16 MED ORDER — LAMOTRIGINE 25 MG PO TABS
150.0000 mg | ORAL_TABLET | Freq: Every day | ORAL | Status: DC
  Administered 2017-06-16: 18:00:00 150 mg via ORAL
  Filled 2017-06-16: qty 2

## 2017-06-16 NOTE — Discharge Instructions (Signed)
Please follow-up with [facility] tomorrow to refill your medication. Please return to emergency department if you develop any new or worsening symptoms or

## 2017-06-16 NOTE — ED Triage Notes (Signed)
Pt states he is here for a refill of his Lamictal 150 mg. Pt states he only needs one dose until he is able to get to [facility]

## 2017-06-16 NOTE — ED Provider Notes (Signed)
[facility]-EMERGENCY DEPT Provider Note   CSN: [id number] Arrival date & time: 06/16/17  1518     History   Chief Complaint Chief Complaint  Patient presents with  . Medication Refill    HPI Spencer Rogers is a 21 y.o. male with history of bipolar 1 disorder, PTSD, polysubstance abuse who presents for medication dose. Patient reports he ran out of his Lamictal last night and plans to have it refilled tomorrow. Patient called 3 days ago, however [facility] did not call him back. He plans to go tomorrow. He denies any symptoms, including SI, HI, or AVH. He does report a fullness in his ears and feels that he may have wax. He denies pain or fevers, however.  HPI  Past Medical History:  Diagnosis Date  . Bipolar 1 disorder (HCC)   . History of drug overdose   . Polysubstance abuse    opiates, benzos, cocaine, marijuana  . PTSD (post-traumatic stress disorder)   . Substance induced mood disorder [facility])     Patient Active Problem List   Diagnosis Date Noted  . Rhabdomyolysis 01/08/2017  . CAP (community acquired pneumonia) 01/08/2017  . 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 Transaminasemia 01/08/2017  . Accidental overdose of heroin   . Lower extremity weakness   . Posttraumatic stress disorder 05/22/2014  . Suicidal ideations 05/22/2014  . Bereavement 05/22/2014  . Substance induced mood disorder (HCC) 05/22/2014  . Severe opioid use disorder (HCC) 05/22/2014  . Opiate overdose 05/18/2014  . Cocaine abuse 05/18/2014  . Cannabis abuse 05/18/2014  . Benzodiazepine abuse 05/18/2014    Past Surgical History:  Procedure Laterality Date  . unknown         Home Medications    Prior to Admission medications   Medication Sig Start Date End Date Taking? Authorizing Provider  amoxicillin-clavulanate (AUGMENTIN) 875-125 MG tablet Take 1 tablet by mouth every 12 (twelve) hours. 01/11/17   Catarina Hartshorn, MD  clonazePAM (KLONOPIN) 1 MG tablet Take 1 mg by mouth once.     [provider]  hydrOXYzine (ATARAX/VISTARIL) 25 MG tablet Take 1 tablet (25 mg) three times daily as needed: For anxiety Patient not taking: Reported on 03/06/2015 05/25/14   Armandina Stammer I, NP  OVER THE COUNTER MEDICATION Kratom herb from local smoke shop to help with opiod abuse  Takes 12 capsules in the morning and 2-3 capsules every 15 minutes throughout the day    [provider]  traZODone (DESYREL) 50 MG tablet Take 1 tablet (50 mg total) by mouth at bedtime as needed for sleep. Patient not taking: Reported on 03/06/2015 05/25/14   Sanjuana Kava, NP    Family History Family History  Problem Relation Age of Onset  . Sudden Cardiac Death Neg Hx     Social History Social History  Substance Use Topics  . Smoking status: Current Every Day Smoker    Packs/day: 1.00    Years: 10.00    Types: Cigarettes  . Smokeless tobacco: Never Used  . Alcohol use Yes     Comment: occasionally      Allergies   Patient has no known allergies.   Review of Systems Review of Systems  Constitutional: Negative for fever.  HENT: Negative for ear pain.   Psychiatric/Behavioral: Negative for hallucinations and suicidal ideas. The patient is not nervous/anxious.      Physical Exam Updated Vital Signs BP (!) 124/93 (BP Location: Left Arm)   Pulse 98   Temp 98 F (  36.7 C) (Oral)   Resp 18   SpO2 98%   Physical Exam  Constitutional: He appears well-developed and well-nourished. No distress.  HENT:  Head: Normocephalic and atraumatic.  Mouth/Throat: Oropharynx is clear and moist. No oropharyngeal exudate.  Bilateral cerumen impaction  Eyes: Pupils are equal, round, and reactive to light. Conjunctivae are normal. Right eye exhibits no discharge. Left eye exhibits no discharge. No scleral icterus.  Neck: Normal range of motion. Neck supple. No thyromegaly present.  Cardiovascular: Normal rate, regular rhythm, normal heart sounds and intact distal pulses.  Exam reveals no  gallop and no friction rub.   No murmur heard. Pulmonary/Chest: Effort normal and breath sounds normal. No stridor. No respiratory distress. He has no wheezes. He has no rales.  Abdominal: Soft. Bowel sounds are normal. He exhibits no distension. There is no tenderness. There is no rebound and no guarding.  Musculoskeletal: He exhibits no edema.  Lymphadenopathy:    He has no cervical adenopathy.  Neurological: He is alert. Coordination normal.  Skin: Skin is warm and dry. No rash noted. He is not diaphoretic. No pallor.  Psychiatric: He has a normal mood and affect.  Nursing note and vitals reviewed.    ED Treatments / Results  Labs (all labs ordered are listed, but only abnormal results are displayed) Labs Reviewed - No data to display  EKG  EKG Interpretation None       Radiology No results found.  Procedures Procedures (including critical care time)  Medications Ordered in ED Medications  lamoTRIgine (LAMICTAL) tablet 150 mg (not administered)     Initial Impression / Assessment and Plan / ED Course  I have reviewed the triage vital signs and the nursing notes.  Pertinent labs & imaging results that were available during my care of the patient were reviewed by me and considered in my medical decision making (see chart for details).     Patient given 1 dose of Lamictal in the ED. Patient not wishing for irrigation of wax ear, he just wanted to know that he could by earwax drops over-the-counter. Patient denies any fevers or pain indicative of otitis media infection. Return precautions discussed. Patient plans to get his prescription refilled tomorrow. Patient understands and agrees with plan. Patient vitals stable throughout ED course discharged in satisfactory condition.  Final Clinical Impressions(s) / ED Diagnoses   Final diagnoses:  Medication refill  Bilateral impacted cerumen    New Prescriptions New Prescriptions   No medications on file     Verdis Prime 06/16/17 Alain Honey, MD 06/17/17 [phone]

## 2018-02-28 NOTE — Congregational Nurse Program (Signed)
Congregational Nurse Program Note  Date of Encounter: 02/26/2018  Past Medical History: Past Medical History:  Diagnosis Date  . Bipolar 1 disorder (HCC)   . History of drug overdose   . Polysubstance abuse    opiates, benzos, cocaine, marijuana  . PTSD (post-traumatic stress disorder)   . Substance induced mood disorder [facility])     Encounter Details: CNP Questionnaire - 02/28/18 1000      Questionnaire   Patient Status  Not Applicable    Race  White or Caucasian    Location Patient Served At  [facility]  Not Applicable    Uninsured  Uninsured (NEW 1x/quarter)    Food  No food insecurities    Housing/Utilities  Yes, have permanent housing;Worried about losing housing    Transportation  No transportation needs    Economist  Yes, feel physically and emotionally safe where you currently live    Medication  Yes, have medication insecurities    Medical Provider  Yes    Referrals  Not Applicable    ED Visit Averted  Not Applicable    Life-Saving Intervention Made  Not Applicable       harm reduction clinic - no health concerns this visit

## 2018-03-30 NOTE — Congregational Nurse Program (Signed)
Congregational Nurse Program Note  Date of Encounter: 03/27/2018  Past Medical History: Past Medical History:  Diagnosis Date  . Bipolar 1 disorder (HCC)   . History of drug overdose   . Polysubstance abuse    opiates, benzos, cocaine, marijuana  . PTSD (post-traumatic stress disorder)   . Substance induced mood disorder [facility](HCC)     Encounter Details: CNP Questionnaire - 03/27/18 1503      Questionnaire   Patient Status  Not Applicable    Race  White or Caucasian    [facility]    Insurance  Not Applicable    Uninsured  Uninsured (Subsequent visits/quarter)    Food  No food insecurities    Housing/Utilities  Yes, have permanent housing;Worried about losing housing    Transportation  No transportation needs    Economistnterpersonal Safety  Yes, feel physically and emotionally safe where you currently live    Medication  Yes, have medication insecurities    Medical Provider  Yes    Referrals  Not Applicable    ED Visit Averted  Not Applicable    Life-Saving Intervention Made  Not Applicable      Reports hardened area approximate half dollar in size left forarm.  Instructed to apply warm moist packs several times a day.  Discussed s/s of infection and instructed to seek medical attention if symptoms occur

## 2018-04-28 ENCOUNTER — Encounter (HOSPITAL_COMMUNITY): Payer: Self-pay | Admitting: *Deleted

## 2018-04-28 ENCOUNTER — Emergency Department (HOSPITAL_COMMUNITY): Payer: Self-pay

## 2018-04-28 ENCOUNTER — Emergency Department (HOSPITAL_COMMUNITY)
Admission: EM | Admit: 2018-04-28 | Discharge: 2018-04-28 | Disposition: A | Discharge status: Home / Self Care | Payer: Self-pay | Attending: Emergency Medicine | Admitting: Emergency Medicine

## 2018-04-28 DIAGNOSIS — F17210 Nicotine dependence, cigarettes, uncomplicated: Secondary | ICD-10-CM | POA: Insufficient documentation

## 2018-04-28 DIAGNOSIS — R42 Dizziness and giddiness: Secondary | ICD-10-CM | POA: Insufficient documentation

## 2018-04-28 DIAGNOSIS — R000 Tachycardia, unspecified: Secondary | ICD-10-CM | POA: Insufficient documentation

## 2018-04-28 DIAGNOSIS — R079 Chest pain, unspecified: Secondary | ICD-10-CM

## 2018-04-28 DIAGNOSIS — R0789 Other chest pain: Secondary | ICD-10-CM | POA: Insufficient documentation

## 2018-04-28 LAB — D-DIMER, QUANTITATIVE: D-Dimer, Quant: <0.27 ug/mL-FEU (ref 0.00–0.50)

## 2018-04-28 LAB — CBC
HCT: 42.4 % (ref 39.0–52.0)
Hemoglobin: 13.6 g/dL (ref 13.0–17.0)
MCH: 28.8 pg (ref 26.0–34.0)
MCHC: 32.1 g/dL (ref 30.0–36.0)
MCV: 89.6 fL (ref 78.0–100.0)
PLATELETS: 202 10*3/uL (ref 150–400)
RBC: 4.73 MIL/uL (ref 4.22–5.81)
RDW: 13.2 % (ref 11.5–15.5)
WBC: 4.3 10*3/uL (ref 4.0–10.5)

## 2018-04-28 LAB — BASIC METABOLIC PANEL
Anion gap: 8 (ref 5–15)
BUN: 11 mg/dL (ref 6–20)
CALCIUM: 9.2 mg/dL (ref 8.9–10.3)
CO2: 28 mmol/L (ref 22–32)
CREATININE: 1.21 mg/dL (ref 0.61–1.24)
Chloride: 101 mmol/L (ref 98–111)
GFR calc non Af Amer: >60 mL/min (ref >60)
Glucose, Bld: 101 mg/dL — ABNORMAL HIGH (ref 70–99)
Potassium: 4.1 mmol/L (ref 3.5–5.1)
SODIUM: 137 mmol/L (ref 135–145)

## 2018-04-28 LAB — I-STAT TROPONIN, ED: TROPONIN I, POC: 0 ng/mL (ref 0.00–0.08)

## 2018-04-28 MED ORDER — KETOROLAC TROMETHAMINE 15 MG/ML IJ SOLN
15.0000 mg | Freq: Once | INTRAMUSCULAR | Status: AC
  Administered 2018-04-28: 15 mg via INTRAVENOUS
  Filled 2018-04-28: qty 1

## 2018-04-28 MED ORDER — NAPROXEN 500 MG PO TABS: 500.0000 mg | ORAL_TABLET | Freq: Two times a day (BID) | ORAL | 0 refills | Status: DC

## 2018-04-28 MED ORDER — SODIUM CHLORIDE 0.9 % IV BOLUS
1000.0000 mL | Freq: Once | INTRAVENOUS | Status: AC
  Administered 2018-04-28: 1000 mL via INTRAVENOUS

## 2018-04-28 NOTE — ED Notes (Signed)
Pt stable, ambulatory, states understanding of discharge instructions 

## 2018-04-28 NOTE — ED Provider Notes (Signed)
[facility] Emergency Department Provider Note MRN:  [id number]  Arrival date & time: 04/29/18     Chief Complaint   Chest Pain   History of Present Illness   Spencer Rogers is a 22 y.o. year-old male with a history of heroin abuse presenting to the ED with chief complaint of chest pain.  The pain began suddenly 3 days ago after patient accidentally used meth intravenously.  The pain is located in the left side of the chest, nonradiating, constant.  Described as a sharp pain.  Associated with dizziness that has improved today.  Denies fevers, shortness of breath.  Denies cough.  Review of Systems  A complete 10 system review of systems was obtained and all systems are negative except as noted in the HPI and PMH.   Patient's Health History    Past Medical History:  Diagnosis Date  . Bipolar 1 disorder (HCC)   . History of drug overdose   . Polysubstance abuse (HCC)    opiates, benzos, cocaine, marijuana  . PTSD (post-traumatic stress disorder)   . Substance induced mood disorder [facility](HCC)     Past Surgical History:  Procedure Laterality Date  . unknown      Family History  Problem Relation Age of Onset  . Sudden Cardiac Death Neg Hx     Social History   Socioeconomic History  . Marital status: Single    Spouse name: Not on file  . Number of children: Not on file  . Years of education: Not on file  . Highest education level: Not on file  Occupational History  . Not on file  Social Needs  . Financial resource strain: Not on file  . Food insecurity:    Worry: Not on file    Inability: Not on file  . Transportation needs:    Medical: Not on file    Non-medical: Not on file  Tobacco Use  . Smoking status: Current Every Day Smoker    Packs/day: 1.00    Years: 10.00    Pack years: 10.00    Types: Cigarettes  . Smokeless tobacco: Never Used  Substance and Sexual Activity  . Alcohol use: Yes    Comment: occasionally   . Drug use: Yes    Types:  Marijuana, Heroin, Benzodiazepines, IV, Cocaine    Comment: heroin, benzos  . Sexual activity: Never    Birth control/protection: None  Lifestyle  . Physical activity:    Days per week: Not on file    Minutes per session: Not on file  . Stress: Not on file  Relationships  . Social connections:    Talks on phone: Not on file    Gets together: Not on file    Attends religious service: Not on file    Active member of club or organization: Not on file    Attends meetings of clubs or organizations: Not on file    Relationship status: Not on file  . Intimate partner violence:    Fear of current or ex partner: Not on file    Emotionally abused: Not on file    Physically abused: Not on file    Forced sexual activity: Not on file  Other Topics Concern  . Not on file  Social History Narrative  . Not on file     Physical Exam  Vital Signs and Nursing Notes reviewed Vitals:   04/28/18 1305 04/28/18 1330  BP: 105/77 110/85  Pulse: 60 66  Resp: 12 13  Temp:    SpO2: 100% 100%    CONSTITUTIONAL: Disheveled-appearing, NAD NEURO:  Alert and oriented x 3, no focal deficits EYES:  eyes equal and reactive ENT/NECK:  no LAD, no JVD CARDIO: Tachycardic rate, well-perfused, normal S1 and S2; no appreciable murmurs PULM:  CTAB no wheezing or rhonchi GI/GU:  normal bowel sounds, non-distended, non-tender MSK/SPINE:  No gross deformities, no edema SKIN:  no rash, atraumatic PSYCH:  Appropriate speech and behavior  Diagnostic and Interventional Summary    EKG Interpretation  Date/Time:  Monday April 28 2018 10:37:59 EDT Ventricular Rate:  133 PR Interval:  140 QRS Duration: 84 QT Interval:  296 QTC Calculation: 440 R Axis:   86 Text Interpretation:  Sinus tachycardia Biatrial enlargement Abnormal ECG Confirmed by Kennis Carina [phone]) on 04/28/2018 12:16:08 PM      Labs Reviewed  BASIC METABOLIC PANEL - Abnormal; Notable for the following components:      Result Value   Glucose,  Bld 101 (*)    All other components within normal limits  CBC  D-DIMER, QUANTITATIVE (NOT AT [facility])  I-STAT TROPONIN, ED    DG Chest 2 View  Final Result      Medications  sodium chloride 0.9 % bolus 1,000 mL (0 mLs Intravenous Stopped 04/28/18 1339)  ketorolac (TORADOL) 15 MG/ML injection 15 mg (15 mg Intravenous Given 04/28/18 1236)     Procedures Critical Care  ED Course and Medical Decision Making  I have reviewed the triage vital signs and the nursing notes.  Pertinent labs & imaging results that were available during my care of the patient were reviewed by me and considered in my medical decision making (see below for details). Clinical Course as of Apr 29 12  Mon Apr 28, 2018  3176 22 year old male history of IV drug use, normally heroin here with the chest pain since accidentally shooting up with methamphetamine 3 days ago.  Tachycardic on arrival, no appreciable murmurs on exam, afebrile.  Tachycardia resolved spontaneously.  Nothing to suggest endocarditis on exam, troponin negative, EKG with no ischemic changes.  Low risk for PE, d-dimer pending.   [MB]  1329 D-dimer negative, chest x-ray unremarkable.  Favoring benign cause of chest pain today, most likely MSK.  Will encourage patient to stop using IV drugs, and provide strict return precautions.After the discussed management above, the patient was determined to be safe for discharge.  The patient was in agreement with this plan and all questions regarding their care were answered.  ED return precautions were discussed and the patient will return to the ED with any significant worsening of condition.   [MB]    Clinical Course User Index [MB] Pilar Plate Elmer Sow, MD     Elmer Sow. Pilar Plate, MD [facility] Emergency Medicine [facility] [email or web address]  Final Clinical Impressions(s) / ED Diagnoses     ICD-10-CM   1. Chest pain, unspecified type R07.9     ED Discharge Orders        Ordered    naproxen  (NAPROSYN) 500 MG tablet  2 times daily     04/28/18 1331         Sabas Sous, MD 04/29/18 [phone]

## 2018-04-28 NOTE — ED Triage Notes (Signed)
Pt in c/o chest pain and palpitations after "doing a whole bunch of meth" a few days ago, patient states since that time the symptoms keep happening every time he uses, denies meth use today but did use heroin PTA

## 2018-04-28 NOTE — Discharge Instructions (Addendum)
You were evaluated at the [place] emergency Department.  After careful evaluation, we did not find any emergent condition requiring admission or further testing in the hospital. ° °Please return to the Emergency Department if you experience any worsening of your condition.  We encourage you to follow up with a primary care provider.  Thank you for allowing us to be a part of your care. °

## 2018-05-27 ENCOUNTER — Other Ambulatory Visit: Payer: Self-pay

## 2018-05-27 ENCOUNTER — Emergency Department (HOSPITAL_COMMUNITY)
Admission: EM | Admit: 2018-05-27 | Discharge: 2018-05-27 | Disposition: A | Discharge status: Home / Self Care | Payer: Self-pay | Attending: Emergency Medicine | Admitting: Emergency Medicine

## 2018-05-27 ENCOUNTER — Emergency Department (HOSPITAL_COMMUNITY): Payer: Self-pay

## 2018-05-27 DIAGNOSIS — T401X4A Poisoning by heroin, undetermined, initial encounter: Secondary | ICD-10-CM

## 2018-05-27 DIAGNOSIS — T507X4A Poisoning by analeptics and opioid receptor antagonists, undetermined, initial encounter: Secondary | ICD-10-CM | POA: Insufficient documentation

## 2018-05-27 DIAGNOSIS — F17210 Nicotine dependence, cigarettes, uncomplicated: Secondary | ICD-10-CM | POA: Insufficient documentation

## 2018-05-27 DIAGNOSIS — N182 Chronic kidney disease, stage 2 (mild): Secondary | ICD-10-CM | POA: Insufficient documentation

## 2018-05-27 LAB — CBC WITH DIFFERENTIAL/PLATELET
BASOS ABS: 0 10*3/uL (ref 0.0–0.1)
BASOS PCT: 0 %
EOS PCT: 1 %
Eosinophils Absolute: 0.1 10*3/uL (ref 0.0–0.7)
HCT: 41.3 % (ref 39.0–52.0)
Hemoglobin: 13.9 g/dL (ref 13.0–17.0)
Lymphocytes Relative: 9 %
Lymphs Abs: 0.8 10*3/uL (ref 0.7–4.0)
MCH: 29.4 pg (ref 26.0–34.0)
MCHC: 33.7 g/dL (ref 30.0–36.0)
MCV: 87.5 fL (ref 78.0–100.0)
MONO ABS: 1 10*3/uL (ref 0.1–1.0)
Monocytes Relative: 12 %
Neutro Abs: 6.5 10*3/uL (ref 1.7–7.7)
Neutrophils Relative %: 78 %
PLATELETS: 185 10*3/uL (ref 150–400)
RBC: 4.72 MIL/uL (ref 4.22–5.81)
RDW: 12.9 % (ref 11.5–15.5)
WBC: 8.3 10*3/uL (ref 4.0–10.5)

## 2018-05-27 LAB — COMPREHENSIVE METABOLIC PANEL
ALT: 15 U/L (ref 0–44)
AST: 22 U/L (ref 15–41)
Albumin: 4.3 g/dL (ref 3.5–5.0)
Alkaline Phosphatase: 65 U/L (ref 38–126)
Anion gap: 15 (ref 5–15)
BUN: 19 mg/dL (ref 6–20)
CO2: 23 mmol/L (ref 22–32)
Calcium: 9.4 mg/dL (ref 8.9–10.3)
Chloride: 102 mmol/L (ref 98–111)
Creatinine, Ser: 1.35 mg/dL — ABNORMAL HIGH (ref 0.61–1.24)
GFR calc Af Amer: >60 mL/min (ref >60)
Glucose, Bld: 108 mg/dL — ABNORMAL HIGH (ref 70–99)
POTASSIUM: 3.6 mmol/L (ref 3.5–5.1)
SODIUM: 140 mmol/L (ref 135–145)
Total Bilirubin: 1.5 mg/dL — ABNORMAL HIGH (ref 0.3–1.2)
Total Protein: 7.1 g/dL (ref 6.5–8.1)

## 2018-05-27 LAB — TROPONIN I

## 2018-05-27 LAB — CK: CK TOTAL: 142 U/L (ref 49–397)

## 2018-05-27 LAB — ETHANOL: Alcohol, Ethyl (B): <10 mg/dL (ref <10)

## 2018-05-27 LAB — I-STAT CG4 LACTIC ACID, ED: LACTIC ACID, VENOUS: 0.81 mmol/L (ref 0.5–1.9)

## 2018-05-27 MED ORDER — NAPROXEN 500 MG PO TABS: 500.0000 mg | ORAL_TABLET | Freq: Two times a day (BID) | ORAL | Status: DC | PRN

## 2018-05-27 MED ORDER — SODIUM CHLORIDE 0.9 % IV BOLUS: 1000.0000 mL | Freq: Once | INTRAVENOUS | Status: DC

## 2018-05-27 MED ORDER — CLONIDINE HCL 0.1 MG PO TABS: 0.1000 mg | ORAL_TABLET | ORAL | Status: DC

## 2018-05-27 MED ORDER — ALUM & MAG HYDROXIDE-SIMETH 200-200-20 MG/5ML PO SUSP: 30.0000 mL | Freq: Four times a day (QID) | ORAL | Status: DC | PRN

## 2018-05-27 MED ORDER — HYDROXYZINE HCL 25 MG PO TABS
25.0000 mg | ORAL_TABLET | Freq: Four times a day (QID) | ORAL | Status: DC | PRN
  Administered 2018-05-27: 25 mg via ORAL
  Filled 2018-05-27: qty 1

## 2018-05-27 MED ORDER — ACETAMINOPHEN 325 MG PO TABS: 650.0000 mg | ORAL_TABLET | ORAL | Status: DC | PRN

## 2018-05-27 MED ORDER — CLONIDINE HCL 0.1 MG PO TABS
0.1000 mg | ORAL_TABLET | Freq: Four times a day (QID) | ORAL | Status: DC
  Administered 2018-05-27: 0.1 mg via ORAL
  Filled 2018-05-27: qty 1

## 2018-05-27 MED ORDER — ONDANSETRON 4 MG PO TBDP
4.0000 mg | ORAL_TABLET | Freq: Four times a day (QID) | ORAL | Status: DC | PRN
  Administered 2018-05-27: 4 mg via ORAL
  Filled 2018-05-27: qty 1

## 2018-05-27 MED ORDER — LOPERAMIDE HCL 2 MG PO CAPS: 2.0000 mg | ORAL_CAPSULE | ORAL | Status: DC | PRN

## 2018-05-27 MED ORDER — DICYCLOMINE HCL 20 MG PO TABS
20.0000 mg | ORAL_TABLET | Freq: Four times a day (QID) | ORAL | Status: DC | PRN
  Administered 2018-05-27: 20 mg via ORAL
  Filled 2018-05-27: qty 1

## 2018-05-27 MED ORDER — METHOCARBAMOL 500 MG PO TABS
500.0000 mg | ORAL_TABLET | Freq: Three times a day (TID) | ORAL | Status: DC | PRN
Start: 2018-05-27 — End: 2018-05-27
  Administered 2018-05-27: 500 mg via ORAL
  Filled 2018-05-27: qty 1

## 2018-05-27 MED ORDER — CLONIDINE HCL 0.1 MG PO TABS: 0.1000 mg | ORAL_TABLET | Freq: Every day | ORAL | Status: DC

## 2018-05-27 NOTE — ED Notes (Signed)
Bed: [id number] Expected date:  Expected time:  Means of arrival:  Comments: Heroin use EMS

## 2018-05-27 NOTE — Progress Notes (Addendum)
I saw patient face-to-face today and patient has continued to deny any SI/HI/AVH and contracts for safety. 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 He does admit to using heroin today, "It was a nosey maid looking through my window." There was no narcan given to the patient to revive him. He woke when the police/EMS knocked on his door. Patient does not meet inpatient criteria and is psychiatrically cleared.I spoke with Dr. Clarene DukeLittle and notified her of the recommendations and she is in agreement.   Patient's chart reviewed and case discussed with the physician extender and developed treatment plan. Reviewed the information documented and agree with the treatment plan.  Juanetta BeetsJacqueline Liston Thum, DO 05/28/18 8:48 PM
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
## 2018-05-27 NOTE — ED Triage Notes (Signed)
Patient arrives with c/o heroin overdose. Patient responsive to painful stimuli for EMS. Patient told PD this was a suicide attempt. Denies any pain at this time. BP: 128/98 HR: 110 RR: 18 O2: 99% CBG: 111

## 2018-05-27 NOTE — ED Notes (Signed)
Patient denies SI, states he has been suicidal in the past now but not today. Patient states he just took much heroin.

## 2018-05-27 NOTE — ED Provider Notes (Signed)
[facility]-EMERGENCY DEPT Provider Note   CSN: [id number] Arrival date & time: 05/27/18  1345     History   Chief Complaint Chief Complaint  Patient presents with  . Drug Overdose  . Suicidal  . IVC    HPI Spencer Rogers is a 22 y.o. male.  HPI Patient with history of polysubstance abuse and bipolar disorder brought in by EMS after being found unresponsive in a hotel room.  Patient states that he was snorting heroin and also taken a Klonopin.  He currently is denying any suicidal intent.  States he been having episodic palpitations and chest pain for which she is been evaluated for in the emergency department.  Denies recent alcohol or stimulant use.  [employer] escorted patient to the emergency department.  States that patient was found a hotel room with needle in his left wrist.  Told [employer] that he took much higher dose of injected heroin and attempt to "find greener pastures". Past Medical History:  Diagnosis Date  . Bipolar 1 disorder (HCC)   . History of drug overdose   . Polysubstance abuse (HCC)    opiates, benzos, cocaine, marijuana  . PTSD (post-traumatic stress disorder)   . Substance induced mood disorder [facility](HCC)     Patient Active Problem List   Diagnosis Date Noted  . Rhabdomyolysis 01/08/2017  . CAP (community acquired pneumonia) 01/08/2017  . 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 Transaminasemia 01/08/2017  . Accidental overdose of heroin (HCC)   . Lower extremity weakness   . Posttraumatic stress disorder 05/22/2014  . Suicidal ideations 05/22/2014  . Bereavement 05/22/2014  . Substance induced mood disorder (HCC) 05/22/2014  . Severe opioid use disorder (HCC) 05/22/2014  . Opiate overdose (HCC) 05/18/2014  . Cocaine abuse (HCC) 05/18/2014  . Cannabis abuse 05/18/2014  . Benzodiazepine abuse (HCC) 05/18/2014    Past Surgical History:  Procedure Laterality Date  . unknown          Home Medications    Prior to  Admission medications   Medication Sig Start Date End Date Taking? Authorizing Provider  clonazePAM (KLONOPIN) 0.5 MG tablet Take 0.5 mg by mouth 4 (four) times daily as needed for anxiety.   Yes [provider]  cloNIDine (CATAPRES) 0.1 MG tablet Take 0.1 mg by mouth at bedtime.    Yes [provider]  lamoTRIgine (LAMICTAL) 200 MG tablet Take 200 mg by mouth at bedtime.    Yes [provider]  risperidone (RISPERDAL) 4 MG tablet Take 4 mg by mouth at bedtime.    Yes [provider]  hydrOXYzine (ATARAX/VISTARIL) 25 MG tablet Take 1 tablet (25 mg) three times daily as needed: For anxiety Patient not taking: Reported on 05/27/2018 05/25/14   Armandina StammerNwoko, Agnes I, NP  naproxen (NAPROSYN) 500 MG tablet Take 1 tablet (500 mg total) by mouth 2 (two) times daily. Patient not taking: Reported on 05/27/2018 04/28/18   Sabas SousBero, Michael M, MD  traZODone (DESYREL) 50 MG tablet Take 1 tablet (50 mg total) by mouth at bedtime as needed for sleep. Patient not taking: Reported on 05/27/2018 05/25/14   Armandina StammerNwoko, Agnes I, NP    Family History Family History  Problem Relation Age of Onset  . Sudden Cardiac Death Neg Hx     Social History Social History   Tobacco Use  . Smoking status: Current Every Day Smoker    Packs/day: 1.00    Years: 10.00    Pack years: 10.00  Types: Cigarettes  . Smokeless tobacco: Never Used  Substance Use Topics  . Alcohol use: Yes    Comment: occasionally   . Drug use: Yes    Types: Marijuana, Heroin, Benzodiazepines, IV, Cocaine    Comment: heroin, benzos     Allergies   Patient has no known allergies.   Review of Systems Review of Systems  Constitutional: Negative for chills and fever.  HENT: Negative for trouble swallowing.   Eyes: Negative for visual disturbance.  Respiratory: Negative for cough and shortness of breath.   Cardiovascular: Positive for chest pain and palpitations. Negative for leg swelling.  Gastrointestinal: Negative  for abdominal pain, constipation, diarrhea, nausea and vomiting.  Musculoskeletal: Negative for back pain, myalgias and neck pain.  Skin: Positive for rash and wound.  Neurological: Negative for dizziness, weakness, light-headedness, numbness and headaches.  All other systems reviewed and are negative.    Physical Exam Updated Vital Signs BP 110/74 (BP Location: Right Arm)   Pulse (!) 114   Temp 98.8 F (37.1 C) (Oral)   Resp 20   Ht 5\' 10"  (1.778 m)   Wt 59 kg   SpO2 97%   BMI 18.65 kg/m   Physical Exam  Constitutional: He is oriented to person, place, and time. He appears well-developed and well-nourished. No distress.  HENT:  Head: Normocephalic and atraumatic.  Mouth/Throat: Oropharynx is clear and moist. No oropharyngeal exudate.  Eyes: Pupils are equal, round, and reactive to light. EOM are normal.  Neck: Normal range of motion. Neck supple.  No meningismus  Cardiovascular: Normal rate and regular rhythm. Exam reveals no gallop and no friction rub.  No murmur heard. Pulmonary/Chest: Effort normal and breath sounds normal. No stridor. No respiratory distress. He has no wheezes. He has no rales. He exhibits no tenderness.  Abdominal: Soft. Bowel sounds are normal. There is no tenderness. There is no rebound and no guarding.  Musculoskeletal: Normal range of motion. He exhibits no edema or tenderness.  Neurological: He is alert and oriented to person, place, and time.  Awake and alert.  Moving all extremities without focal deficit.  Sensation fully intact.  Skin: Skin is warm and dry. Capillary refill takes less than 2 seconds. No rash noted. He is not diaphoretic. No erythema.  Multiple track marks of bilateral wrist.  Patient has area of erythema on the radial aspect of the left wrist.  No fluctuance.  No nailbed splinter hemorrhages.   Psychiatric: He has a normal mood and affect. His behavior is normal.  Patient is mildly agitated and at times uncooperative.  Denying  suicidal ideation  Nursing note and vitals reviewed.    ED Treatments / Results  Labs (all labs ordered are listed, but only abnormal results are displayed) Labs Reviewed  COMPREHENSIVE METABOLIC PANEL - Abnormal; Notable for the following components:      Result Value   Glucose, Bld 108 (*)    Creatinine, Ser 1.35 (*)    Total Bilirubin 1.5 (*)    All other components within normal limits  CULTURE, BLOOD (ROUTINE X 2)  CULTURE, BLOOD (ROUTINE X 2)  CBC WITH DIFFERENTIAL/PLATELET  TROPONIN I  ETHANOL  CK  I-STAT CG4 LACTIC ACID, ED    EKG None  Radiology Dg Chest 2 View  Result Date: 05/27/2018 CLINICAL DATA:  Heroin overdose EXAM: CHEST - 2 VIEW COMPARISON:  04/28/2018 FINDINGS: Cardiac shadow is within normal limits. The lungs are well aerated bilaterally. No focal infiltrate or sizable effusion is seen. No  acute bony abnormality noted. IMPRESSION: No active cardiopulmonary disease. Electronically Signed   By: Alcide Clever M.D.   On: 05/27/2018 16:05    Procedures Procedures (including critical care time)  Medications Ordered in ED Medications - No data to display   Initial Impression / Assessment and Plan / ED Course  I have reviewed the triage vital signs and the nursing notes.  Pertinent labs & imaging results that were available during my care of the patient were reviewed by me and considered in my medical decision making (see chart for details).    Signed out to oncoming emergency physician pending initial medical screening.  Will need TTS evaluation for accidental versus intentional overdose.   Final Clinical Impressions(s) / ED Diagnoses   Final diagnoses:  Diacetylmorphine overdose, undetermined intent, initial encounter [facility])    ED Discharge Orders         Ordered    Increase activity slowly     05/27/18 1818    Diet - low sodium heart healthy     05/27/18 1818           Loren Racer, MD 05/28/18 1325

## 2018-05-27 NOTE — ED Notes (Deleted)
ED TO INPATIENT HANDOFF REPORT  Name/Age/Gender Spencer Rogers 22 y.o. male  Code Status Code Status History    Date Active Date Inactive Code Status Order ID Comments User Context   01/08/2017 2032 01/11/2017 1627 Full Code [id number]  Briscoe Deutscher, MD ED   05/22/2014 2110 05/25/2014 1646 Full Code [id number]  Kristeen Mans, NP Inpatient      Home/SNF/Other Home  Chief Complaint Heroin Use  Level of Care/Admitting Diagnosis ED Disposition    None      Medical History Past Medical History:  Diagnosis Date  . Bipolar 1 disorder (HCC)   . History of drug overdose   . Polysubstance abuse (HCC)    opiates, benzos, cocaine, marijuana  . PTSD (post-traumatic stress disorder)   . Substance induced mood disorder (HCC)     Allergies No Known Allergies  IV Location/Drains/Wounds Patient Lines/Drains/Airways Status   Active Line/Drains/Airways    None          Labs/Imaging Results for orders placed or performed during the hospital encounter of 05/27/18 (from the past 48 hour(s))  CBC with Differential/Platelet     Status: None   Collection Time: 05/27/18  2:14 PM  Result Value Ref Range   WBC 8.3 4.0 - 10.5 K/uL   RBC 4.72 4.22 - 5.81 MIL/uL   Hemoglobin 13.9 13.0 - 17.0 g/dL   HCT 78.2 95.6 - 21.3 %   MCV 87.5 78.0 - 100.0 fL   MCH 29.4 26.0 - 34.0 pg   MCHC 33.7 30.0 - 36.0 g/dL   RDW 08.6 57.8 - 46.9 %   Platelets 185 150 - 400 K/uL   Neutrophils Relative % 78 %   Neutro Abs 6.5 1.7 - 7.7 K/uL   Lymphocytes Relative 9 %   Lymphs Abs 0.8 0.7 - 4.0 K/uL   Monocytes Relative 12 %   Monocytes Absolute 1.0 0.1 - 1.0 K/uL   Eosinophils Relative 1 %   Eosinophils Absolute 0.1 0.0 - 0.7 K/uL   Basophils Relative 0 %   Basophils Absolute 0.0 0.0 - 0.1 K/uL    Comment: Performed at [facility], [address], [place], [place] [address]  I-Stat CG4 Lactic Acid, ED     Status: None   Collection Time: 05/27/18  3:24 PM  Result Value Ref Range    Lactic Acid, Venous 0.81 0.5 - 1.9 mmol/L   No results found.  Pending Labs Unresulted Labs (From admission, onward)    Start     Ordered   05/27/18 1416  CK  STAT,   STAT     05/27/18 1417   05/27/18 1414  Comprehensive metabolic panel  STAT,   STAT     05/27/18 1417   05/27/18 1414  Culture, blood (Routine X 2) w Reflex to ID Panel  BLOOD CULTURE X 2,   STAT     05/27/18 1417   05/27/18 1414  Troponin I  STAT,   STAT     05/27/18 1417   05/27/18 1414  Ethanol  STAT,   STAT     05/27/18 1417   05/27/18 1414  Rapid urine drug screen (hospital performed)  STAT,   R     05/27/18 1417          Vitals/Pain Today's Vitals   05/27/18 1352 05/27/18 1355  BP: 117/82   Pulse: 73   Resp: 14   Temp: 98.8 F (37.1 C)   TempSrc: Oral   SpO2: 100%  Weight:  59 kg  Height:  5\' 10"  (1.778 m)  PainSc:  0-No pain    Isolation Precautions No active isolations  Medications Medications - No data to display  Mobility walks

## 2018-05-27 NOTE — ED Notes (Signed)
Patient to TCU at this time accompanied by security and PD.

## 2018-05-27 NOTE — ED Provider Notes (Addendum)
I received this patient in signout from Dr. Ranae PalmsYelverton.  He had presented with heroin overdose, concern for intentional versus unintentional overdose.  We were awaiting completion of lab work prior to involving psychiatry team.  IVC was completed by police.  Lab work is overall reassuring, very mild elevation of creatinine compared to previous.  He is tolerating p.o. therefore encouraged aggressive fluid hydration by mouth.  I have consulted TTS for evaluation.  Disposition will be determined by their recommendations.  I have discussed work-up findings and plan with the patient.  I have ordered detox protocol.   Lorielle Boehning, Ambrose Finlandachel Morgan, MD 05/27/18 1728  6:36 PM With Feliz Beamravis, NP for psychiatry team.  He interviewed patient and patient has vehemently denied any suicidal ideation.  No indication that he is unsafe therefore psychiatry team has recommended discharge.  I have rescinded IVC papers.  Patient provided with outpatient resources for detox.   Kam Kushnir, Ambrose Finlandachel Morgan, MD 05/27/18 [phone]

## 2018-06-01 LAB — CULTURE, BLOOD (ROUTINE X 2)
CULTURE: NO GROWTH
Culture: NO GROWTH
Special Requests: ADEQUATE
Special Requests: ADEQUATE

## 2018-11-16 ENCOUNTER — Emergency Department (HOSPITAL_COMMUNITY)
Admission: EM | Admit: 2018-11-16 | Discharge: 2018-11-16 | Disposition: A | Discharge status: Home / Self Care | Payer: Self-pay | Attending: Emergency Medicine | Admitting: Emergency Medicine

## 2018-11-16 DIAGNOSIS — Z5321 Procedure and treatment not carried out due to patient leaving prior to being seen by health care provider: Secondary | ICD-10-CM | POA: Insufficient documentation

## 2018-11-16 DIAGNOSIS — R569 Unspecified convulsions: Secondary | ICD-10-CM | POA: Insufficient documentation

## 2018-11-16 NOTE — ED Triage Notes (Signed)
Pt presents to ED for evaluation of what he believes are focal seizures x 6-9 months. Hx bipolar and anxiety, takes Lamictal. No hx of seizures. Observed by EMS to have episode lasting one hour where his eyes dart around and he is only able to focus upward but is A/O x 4 and ambulatory. Symptoms have resolved on arrival.

## 2018-11-16 NOTE — ED Notes (Signed)
Pt left AMA. NT tried to convince to stay but pt was reluctant to do so as Public transport was the only means of getting home. Patiens IV access was taken out before pt walked out.

## 2018-11-19 ENCOUNTER — Other Ambulatory Visit: Payer: Self-pay

## 2018-11-19 ENCOUNTER — Emergency Department (HOSPITAL_COMMUNITY)
Admission: EM | Admit: 2018-11-19 | Discharge: 2018-11-19 | Disposition: A | Discharge status: Home / Self Care | Payer: Self-pay | Attending: Emergency Medicine | Admitting: Emergency Medicine

## 2018-11-19 ENCOUNTER — Encounter (HOSPITAL_COMMUNITY): Payer: Self-pay | Admitting: Emergency Medicine

## 2018-11-19 DIAGNOSIS — Z79899 Other long term (current) drug therapy: Secondary | ICD-10-CM | POA: Insufficient documentation

## 2018-11-19 DIAGNOSIS — F17210 Nicotine dependence, cigarettes, uncomplicated: Secondary | ICD-10-CM | POA: Insufficient documentation

## 2018-11-19 DIAGNOSIS — G2402 Drug induced acute dystonia: Secondary | ICD-10-CM | POA: Insufficient documentation

## 2018-11-19 LAB — CBC
HCT: 48 % (ref 39.0–52.0)
Hemoglobin: 15.8 g/dL (ref 13.0–17.0)
MCH: 30.9 pg (ref 26.0–34.0)
MCHC: 32.9 g/dL (ref 30.0–36.0)
MCV: 93.8 fL (ref 80.0–100.0)
NRBC: 0 % (ref 0.0–0.2)
PLATELETS: 179 10*3/uL (ref 150–400)
RBC: 5.12 MIL/uL (ref 4.22–5.81)
RDW: 11 % — ABNORMAL LOW (ref 11.5–15.5)
WBC: 5.8 10*3/uL (ref 4.0–10.5)

## 2018-11-19 LAB — BASIC METABOLIC PANEL
ANION GAP: 8 (ref 5–15)
BUN: 19 mg/dL (ref 6–20)
CALCIUM: 9.3 mg/dL (ref 8.9–10.3)
CO2: 24 mmol/L (ref 22–32)
Chloride: 109 mmol/L (ref 98–111)
Creatinine, Ser: 1.37 mg/dL — ABNORMAL HIGH (ref 0.61–1.24)
Glucose, Bld: 94 mg/dL (ref 70–99)
Potassium: 4 mmol/L (ref 3.5–5.1)
SODIUM: 141 mmol/L (ref 135–145)

## 2018-11-19 NOTE — Discharge Instructions (Addendum)
Please return for any problem.  Follow-up with your regular psychiatric care providers as instructed.  Your symptoms as described today may be secondary to a dystonic reaction.  It is imperative that you discuss your symptoms with your psychiatrist.  Adjustment of your psychiatric medications may help your symptoms.  If you have a recurrent episode you can try taking a dose of Benadryl (50mg  taken orally) to see if it alleviates your symptoms.

## 2018-11-19 NOTE — ED Provider Notes (Signed)
[facility]   CSN: [id number] Arrival date & time: 11/19/18  1709    History   Chief Complaint Chief Complaint  Patient presents with  . Focal Seizures    HPI Spencer Rogers is a 23 y.o. male.     23 year old male with prior medical history as detailed below presents for evaluation of "possible seizure."  Patient reports ongoing episodes of his eyes becoming "locked upwards".  He reports that these episodes last approximately 1 hour.  He reports approximately one episode per month over the last 9 months..  He reports that he feels like his eyes start to "jerk" and then they become fixed in an upward direction -he denies associated tremor, aura, headache, visual change, nausea, incontinence, or other complaint.  He denies prior history of seizure.  He does report longstanding history of bipolar.  He reports that his current psychiatric medications have not been adjusted and several months.  He denies prior history of dystonic reaction.  Patient reports that when his symptoms occur he likes to try to lie down and close his eyes.  This eventually helps him get over the episode.  He is without current complaint.  The history is provided by the patient and medical records.  Illness  Location:  " Eyes jerking" " eyes fixed upwards" Severity:  Mild Onset quality:  Unable to specify Duration:  9 months Timing:  Sporadic Progression:  Resolved Chronicity:  New   Past Medical History:  Diagnosis Date  . Bipolar 1 disorder (HCC)   . History of drug overdose   . Polysubstance abuse (HCC)    opiates, benzos, cocaine, marijuana  . PTSD (post-traumatic stress disorder)   . Substance induced mood disorder [facility](HCC)     Patient Active Problem List   Diagnosis Date Noted  . Rhabdomyolysis 01/08/2017  . CAP (community acquired pneumonia) 01/08/2017  . 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 Transaminasemia 01/08/2017  .  Accidental overdose of heroin (HCC)   . Lower extremity weakness   . Posttraumatic stress disorder 05/22/2014  . Suicidal ideations 05/22/2014  . Bereavement 05/22/2014  . Substance induced mood disorder (HCC) 05/22/2014  . Severe opioid use disorder (HCC) 05/22/2014  . Opiate overdose (HCC) 05/18/2014  . Cocaine abuse (HCC) 05/18/2014  . Cannabis abuse 05/18/2014  . Benzodiazepine abuse (HCC) 05/18/2014    Past Surgical History:  Procedure Laterality Date  . unknown          Home Medications    Prior to Admission medications   Medication Sig Start Date End Date Taking? Authorizing Provider  clonazePAM (KLONOPIN) 0.5 MG tablet Take 0.5 mg by mouth 4 (four) times daily as needed for anxiety.    [provider]  cloNIDine (CATAPRES) 0.1 MG tablet Take 0.1 mg by mouth at bedtime.     [provider]  hydrOXYzine (ATARAX/VISTARIL) 25 MG tablet Take 1 tablet (25 mg) three times daily as needed: For anxiety Patient not taking: Reported on 05/27/2018 05/25/14   Armandina StammerNwoko, Agnes I, NP  lamoTRIgine (LAMICTAL) 200 MG tablet Take 200 mg by mouth at bedtime.     [provider]  naproxen (NAPROSYN) 500 MG tablet Take 1 tablet (500 mg total) by mouth 2 (two) times daily. Patient not taking: Reported on 05/27/2018 04/28/18   Sabas SousBero, Michael M, MD  risperidone (RISPERDAL) 4 MG tablet Take 4 mg by mouth at bedtime.     [provider]  traZODone (DESYREL) 50  MG tablet Take 1 tablet (50 mg total) by mouth at bedtime as needed for sleep. Patient not taking: Reported on 05/27/2018 05/25/14   Armandina Stammer I, NP    Family History Family History  Problem Relation Age of Onset  . Sudden Cardiac Death Neg Hx     Social History Social History   Tobacco Use  . Smoking status: Current Every Day Smoker    Packs/day: 1.00    Years: 10.00    Pack years: 10.00    Types: Cigarettes  . Smokeless tobacco: Never Used  Substance Use Topics  . Alcohol use: Yes    Comment:  occasionally   . Drug use: Yes    Types: Marijuana, Heroin, Benzodiazepines, IV, Cocaine    Comment: heroin, benzos     Allergies   Patient has no known allergies.   Review of Systems Review of Systems  All other systems reviewed and are negative.    Physical Exam Updated Vital Signs BP 114/76 (BP Location: Left Arm)   Pulse 84   Temp 98.6 F (37 C) (Oral)   Resp 17   Ht 5\' 10"  (1.778 m)   Wt 59 kg   SpO2 98%   BMI 18.65 kg/m   Physical Exam Vitals signs and nursing note reviewed.  Constitutional:      General: He is not in acute distress.    Appearance: He is well-developed.  HENT:     Head: Normocephalic and atraumatic.  Eyes:     Conjunctiva/sclera: Conjunctivae normal.     Pupils: Pupils are equal, round, and reactive to light.  Neck:     Musculoskeletal: Normal range of motion and neck supple.  Cardiovascular:     Rate and Rhythm: Normal rate and regular rhythm.     Heart sounds: Normal heart sounds.  Pulmonary:     Effort: Pulmonary effort is normal. No respiratory distress.     Breath sounds: Normal breath sounds.  Abdominal:     General: There is no distension.     Palpations: Abdomen is soft.     Tenderness: There is no abdominal tenderness.  Musculoskeletal: Normal range of motion.        General: No deformity.  Skin:    General: Skin is warm and dry.  Neurological:     General: No focal deficit present.     Mental Status: He is alert and oriented to person, place, and time. Mental status is at baseline.     Cranial Nerves: No cranial nerve deficit.     Sensory: No sensory deficit.     Motor: No weakness.     Coordination: Coordination normal.      ED Treatments / Results  Labs (all labs ordered are listed, but only abnormal results are displayed) Labs Reviewed  BASIC METABOLIC PANEL - Abnormal; Notable for the following components:      Result Value   Creatinine, Ser 1.37 (*)    All other components within normal limits  CBC -  Abnormal; Notable for the following components:   RDW 11.0 (*)    All other components within normal limits  CBG MONITORING, ED    EKG None  Radiology No results found.  Procedures Procedures (including critical care time)  Medications Ordered in ED Medications - No data to display   Initial Impression / Assessment and Plan / ED Course  I have reviewed the triage vital signs and the nursing notes.  Pertinent labs & imaging results that were available during  my care of the patient were reviewed by me and considered in my medical decision making (see chart for details).       MDM  Screen complete  Patient is presenting for evaluation of reported episodic " eye jerking" and " fixed upward gaze."  Patient is currently asymptomatic.  Patient's describe symptoms are not consistent with likely seizure.  Patient's described symptoms are consistent with a possible dystonic reaction.  Patient is currently taking Risperdal, Lamictal, and Klonopin.  Screening workup in the ED is without findings of acute pathology.  Patient is advised to closely follow-up with his prescribing psychiatrist.  Patient is also advised that if his symptoms fail to resolve outpatient work-up with neurology is appropriate.  Patient appears to be appropriate for discharge.  Patient is given strict seizure precautions.  Patient does understand that he cannot do dangerous activities (includes driving) until his symptoms are resolved.  Importance of close follow-up stressed.  Strict return precautions given and understood.  Final Clinical Impressions(s) / ED Diagnoses   Final diagnoses:  Dystonic drug reaction    ED Discharge Orders    None       Wynetta Fines, MD 11/19/18 [phone]

## 2018-11-19 NOTE — ED Notes (Signed)
Patient verbalizes understanding of discharge instructions. Opportunity for questioning and answers were provided. Armband removed by staff, pt discharged from ED.  

## 2018-11-19 NOTE — ED Triage Notes (Signed)
Pt reports focal seizures that have been going on for the past 9 months. Pt reports his eyes stay straight up and his chest gets tight.

## 2018-11-25 ENCOUNTER — Other Ambulatory Visit: Payer: Self-pay

## 2018-11-25 ENCOUNTER — Emergency Department (HOSPITAL_COMMUNITY)
Admission: EM | Admit: 2018-11-25 | Discharge: 2018-11-26 | Disposition: A | Discharge status: Other Acute Inpatient Hospital | Payer: Self-pay | Attending: Emergency Medicine | Admitting: Emergency Medicine

## 2018-11-25 ENCOUNTER — Emergency Department (HOSPITAL_COMMUNITY): Payer: Self-pay

## 2018-11-25 ENCOUNTER — Encounter (HOSPITAL_COMMUNITY): Payer: Self-pay | Admitting: Emergency Medicine

## 2018-11-25 DIAGNOSIS — T424X2A Poisoning by benzodiazepines, intentional self-harm, initial encounter: Secondary | ICD-10-CM | POA: Insufficient documentation

## 2018-11-25 DIAGNOSIS — D696 Thrombocytopenia, unspecified: Secondary | ICD-10-CM | POA: Insufficient documentation

## 2018-11-25 DIAGNOSIS — R011 Cardiac murmur, unspecified: Secondary | ICD-10-CM | POA: Insufficient documentation

## 2018-11-25 DIAGNOSIS — T50904A Poisoning by unspecified drugs, medicaments and biological substances, undetermined, initial encounter: Secondary | ICD-10-CM

## 2018-11-25 DIAGNOSIS — N182 Chronic kidney disease, stage 2 (mild): Secondary | ICD-10-CM | POA: Insufficient documentation

## 2018-11-25 DIAGNOSIS — F1910 Other psychoactive substance abuse, uncomplicated: Secondary | ICD-10-CM | POA: Insufficient documentation

## 2018-11-25 DIAGNOSIS — F314 Bipolar disorder, current episode depressed, severe, without psychotic features: Secondary | ICD-10-CM | POA: Insufficient documentation

## 2018-11-25 DIAGNOSIS — F4310 Post-traumatic stress disorder, unspecified: Secondary | ICD-10-CM | POA: Insufficient documentation

## 2018-11-25 LAB — CBC WITH DIFFERENTIAL/PLATELET
Abs Immature Granulocytes: 0.09 10*3/uL — ABNORMAL HIGH (ref 0.00–0.07)
BASOS ABS: 0 10*3/uL (ref 0.0–0.1)
Basophils Relative: 0 %
EOS ABS: 0 10*3/uL (ref 0.0–0.5)
Eosinophils Relative: 0 %
HCT: 44.5 % (ref 39.0–52.0)
HEMOGLOBIN: 14.6 g/dL (ref 13.0–17.0)
IMMATURE GRANULOCYTES: 1 %
LYMPHS ABS: 1.3 10*3/uL (ref 0.7–4.0)
LYMPHS PCT: 10 %
MCH: 30.7 pg (ref 26.0–34.0)
MCHC: 32.8 g/dL (ref 30.0–36.0)
MCV: 93.5 fL (ref 80.0–100.0)
Monocytes Absolute: 1 10*3/uL (ref 0.1–1.0)
Monocytes Relative: 8 %
NRBC: 0 % (ref 0.0–0.2)
Neutro Abs: 11 10*3/uL — ABNORMAL HIGH (ref 1.7–7.7)
Neutrophils Relative %: 81 %
Platelets: 140 10*3/uL — ABNORMAL LOW (ref 150–400)
RBC: 4.76 MIL/uL (ref 4.22–5.81)
RDW: 11 % — ABNORMAL LOW (ref 11.5–15.5)
WBC: 13.5 10*3/uL — ABNORMAL HIGH (ref 4.0–10.5)

## 2018-11-25 LAB — COMPREHENSIVE METABOLIC PANEL
ALT: 11 U/L (ref 0–44)
AST: 20 U/L (ref 15–41)
Albumin: 4.6 g/dL (ref 3.5–5.0)
Alkaline Phosphatase: 59 U/L (ref 38–126)
Anion gap: 7 (ref 5–15)
BILIRUBIN TOTAL: 1.4 mg/dL — AB (ref 0.3–1.2)
BUN: 17 mg/dL (ref 6–20)
CO2: 25 mmol/L (ref 22–32)
Calcium: 9.1 mg/dL (ref 8.9–10.3)
Chloride: 108 mmol/L (ref 98–111)
Creatinine, Ser: 1.27 mg/dL — ABNORMAL HIGH (ref 0.61–1.24)
Glucose, Bld: 103 mg/dL — ABNORMAL HIGH (ref 70–99)
POTASSIUM: 3.6 mmol/L (ref 3.5–5.1)
Sodium: 140 mmol/L (ref 135–145)
TOTAL PROTEIN: 6.9 g/dL (ref 6.5–8.1)

## 2018-11-25 LAB — ACETAMINOPHEN LEVEL

## 2018-11-25 LAB — CBG MONITORING, ED: Glucose-Capillary: 98 mg/dL (ref 70–99)

## 2018-11-25 LAB — RAPID URINE DRUG SCREEN, HOSP PERFORMED
Amphetamines: NOT DETECTED
Barbiturates: NOT DETECTED
Benzodiazepines: POSITIVE — AB
Cocaine: NOT DETECTED
OPIATES: NOT DETECTED
TETRAHYDROCANNABINOL: NOT DETECTED

## 2018-11-25 LAB — SALICYLATE LEVEL: Salicylate Lvl: <7 mg/dL (ref 2.8–30.0)

## 2018-11-25 LAB — ETHANOL

## 2018-11-25 LAB — CK: CK TOTAL: 256 U/L (ref 49–397)

## 2018-11-25 MED ORDER — SODIUM CHLORIDE 0.9 % IV BOLUS
1000.0000 mL | Freq: Once | INTRAVENOUS | Status: AC
  Administered 2018-11-25: 1000 mL via INTRAVENOUS

## 2018-11-25 MED ORDER — HYDROXYZINE HCL 25 MG PO TABS
25.0000 mg | ORAL_TABLET | Freq: Four times a day (QID) | ORAL | Status: DC | PRN
  Administered 2018-11-25: 25 mg via ORAL
  Filled 2018-11-25: qty 1

## 2018-11-25 MED ORDER — SODIUM CHLORIDE 0.9 % IV SOLN
INTRAVENOUS | Status: DC
  Administered 2018-11-25 (×2): via INTRAVENOUS

## 2018-11-25 MED ORDER — NICOTINE 21 MG/24HR TD PT24
21.0000 mg | MEDICATED_PATCH | Freq: Once | TRANSDERMAL | Status: DC
  Administered 2018-11-25: 21 mg via TRANSDERMAL
  Filled 2018-11-25: qty 1

## 2018-11-25 MED ORDER — LAMOTRIGINE 100 MG PO TABS: 100.0000 mg | ORAL_TABLET | Freq: Every day | ORAL | Status: DC

## 2018-11-25 MED ORDER — LAMOTRIGINE 100 MG PO TABS: 100.0000 mg | ORAL_TABLET | ORAL | Status: DC

## 2018-11-25 MED ORDER — RISPERIDONE 2 MG PO TABS
4.0000 mg | ORAL_TABLET | Freq: Every day | ORAL | Status: DC
  Administered 2018-11-25: 4 mg via ORAL
  Filled 2018-11-25: qty 2

## 2018-11-25 MED ORDER — LAMOTRIGINE 100 MG PO TABS
200.0000 mg | ORAL_TABLET | Freq: Every day | ORAL | Status: DC
  Administered 2018-11-25: 200 mg via ORAL
  Filled 2018-11-25: qty 2

## 2018-11-25 NOTE — ED Triage Notes (Signed)
Pt BIBA from recovery house, roommate called EMS.  Pt reports taking benzo's since last night, appx 1800.  Unsure of amount consumed, appx 30 pills, unknown dosage.   Pt reports "only trying to get high." Denies SI.  C/o "feeling warm."  Hx of bipolar.  AOx4, ambulatory.

## 2018-11-25 NOTE — ED Notes (Signed)
Bed: NO67 Expected date:  Expected time:  Means of arrival:  Comments: Hold for 13

## 2018-11-25 NOTE — ED Notes (Signed)
Patient reports taking etizolam 2 mg last night. States "I lost count how many."

## 2018-11-25 NOTE — ED Notes (Signed)
[name], PA, patient meets inpatient criteria. TTS to secure placement. [name], RN, informed of disposition.

## 2018-11-25 NOTE — ED Notes (Signed)
Spoke with [employer], [place]-   Notes- tx any agitation with benzo's as needed.

## 2018-11-25 NOTE — ED Notes (Signed)
Pt seen and wand by security.  Pt has 3 bags at desk.

## 2018-11-25 NOTE — BH Assessment (Addendum)
Assessment Note  Spencer Rogers is an 23 y.o. male presenting involuntary, SI with attempted overdose on approximately 30 pills benzos. Patient denied this was a suicide attempt stating "I was just trying to get high" and that he wants to go to [facility] in the morning. Patient reported after taking pills, his roommates at the [facility] came in room and found him stumbling over things and EMS was called. Patient reported he relapsed after 6 months of sobriety. Patient reported trigger of relapsing was being stressed out on his old job. 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 Patient reported last inpatient mental health treatment was in 2015 due to SI. Patient reported family history of suicide, patient father committed suicide by shooting himself with patients gun. Patient reported being on probation due to conviction of 2x DUI's in 2019. Patient reported having [facility] appointment tomorrow at 1pm. Patient reported taking Lamictal, Risperdal and Clonidin. Patient reported side effect from Risperdal of his eyes rolling in back of his head. Patient denied HI and psychosis.   Patient was cooperative during assessment. Patient was drowsy, but oriented x4. Patient mood and affect was depressed, anxious and sad. Patient speech was coherent, along with fair eye contact. Patient denied severity of taking 30 benzodiazepines pills.  ETOH negative UDS +benzodiazepines  Diagnosis: Bipolar disorder, Polysubstance abuse, Major depressive disorder  Past Medical History:  Past Medical History:  Diagnosis Date  . Bipolar 1 disorder (HCC)   . History of drug overdose   . Polysubstance abuse (HCC)    opiates, benzos, cocaine, marijuana  . PTSD (post-traumatic stress disorder)   . Substance induced mood disorder [facility])     Past Surgical History:  Procedure  Laterality Date  . unknown      Family History:  Family History  Problem Relation Age of Onset  . Sudden Cardiac Death Neg Hx     Social History:  reports that he has been smoking cigarettes. He has a 10.00 pack-year smoking history. He has never used smokeless tobacco. He reports current alcohol use. He reports current drug use. Drugs: Marijuana, Heroin, Benzodiazepines, IV, and Cocaine.  Additional Social History:  Alcohol / Drug Use Pain Medications: see MAR Prescriptions: see MAR Over the Counter: see MAR  CIWA: CIWA-Ar BP: 111/76 Pulse Rate: 91 COWS:    Allergies:  Allergies  Allergen Reactions  . Seroquel [Quetiapine Fumarate] Other (See Comments)    Pt said, I can't take Seroquel, it makes me want to kill people.    Home Medications: (Not in a hospital admission)   OB/GYN Status:  No LMP for male patient.  General Assessment Data Location of Assessment: [facility] ED TTS Assessment: In system Is this a Tele or Face-to-Face Assessment?: Face-to-Face Is this an Initial Assessment or a Re-assessment for this encounter?: Initial Assessment Patient Accompanied by:: N/A Language Other than English: No Living Arrangements: Homeless/Shelter([facility]) What gender do you identify as?: Male Marital status: Single Pregnancy Status: (n/a) Living Arrangements: ([facility]) Can pt return to current living arrangement?: Yes(in 14 days) Admission Status: Voluntary Is patient capable of signing voluntary admission?: Yes Referral Source: Librarian, academic)     Crisis Care Plan Living Arrangements: [facility]) Legal Guardian: (self) Name of Psychiatrist: Vesta Mixer) Name of Therapist: (none)  Education Status Is patient currently in school?: No Is the patient employed, unemployed or  receiving disability?: Unemployed  Risk to self with the past 6 months Suicidal Ideation: No Has patient been a risk to self within the past 6 months prior to admission? : No Suicidal  Intent: No Has patient had any suicidal intent within the past 6 months prior to admission? : No Is patient at risk for suicide?: No Suicidal Plan?: No Has patient had any suicidal plan within the past 6 months prior to admission? : No Access to Means: No What has been your use of drugs/alcohol within the last 12 months?: (benzos) Previous Attempts/Gestures: (denied) How many times?: (denied) Other Self Harm Risks: (drug usage) Triggers for Past Attempts: (mental illness) Intentional Self Injurious Behavior: (denied) Family Suicide History: Yes(father 5 years ago shot self with patients gun) Recent stressful life event(s): Job Loss(work related stress) Persecutory voices/beliefs?: No Depression: Yes Depression Symptoms: Guilt, Feeling worthless/self pity Substance abuse history and/or treatment for substance abuse?: Yes  Risk to Others within the past 6 months Homicidal Ideation: No Does patient have any lifetime risk of violence toward others beyond the six months prior to admission? : No Thoughts of Harm to Others: No Current Homicidal Intent: No Current Homicidal Plan: No Access to Homicidal Means: No Identified Victim: (n/a) History of harm to others?: No Assessment of Violence: None Noted Violent Behavior Description: (none reported) Does patient have access to weapons?: No Criminal Charges Pending?: No Does patient have a court date: No Is patient on probation?: Yes(2019 2 DUI's convicted)  Psychosis Hallucinations: None noted Delusions: None noted  Mental Status Report Appearance/Hygiene: Disheveled, In scrubs Eye Contact: Fair Motor Activity: Freedom of movement Speech: Logical/coherent Level of Consciousness: Drowsy Mood: Depressed, Anxious, Sad Affect: Anxious, Depressed, Sad Anxiety Level: Minimal Thought Processes: Coherent, Relevant Judgement: Impaired Orientation: Person, Place, Time, Situation Obsessive Compulsive Thoughts/Behaviors: None  Cognitive  Functioning Concentration: Fair Memory: Recent Intact Is patient IDD: No Insight: Poor Appetite: Good Have you had any weight changes? : No Change Sleep: No Change Total Hours of Sleep: (8-10) Vegetative Symptoms: None  ADLScreening [facility] Assessment Services) Patient's cognitive ability adequate to safely complete daily activities?: Yes Patient able to express need for assistance with ADLs?: Yes Independently performs ADLs?: Yes (appropriate for developmental age)  Prior Inpatient Therapy Prior Inpatient Therapy: Yes Prior Therapy Dates: ([facility]) Prior Therapy Facilty/Provider(s): ([facility]) Reason for Treatment: (SI)  Prior Outpatient Therapy Prior Outpatient Therapy: Yes Prior Therapy Dates: (present) Prior Therapy Facilty/Provider(s): [facility]) Reason for Treatment: (mental illness) Does patient have an ACCT team?: No Does patient have Intensive In-House Services?  : No Does patient have [facility] services? : Yes Does patient have P4[facility] services?: No  ADL Screening (condition at time of admission) Patient's cognitive ability adequate to safely complete daily activities?: Yes Patient able to express need for assistance with ADLs?: Yes Independently performs ADLs?: Yes (appropriate for developmental age)   Merchant navy officer (For Healthcare) Does Patient Have a Medical Advance Directive?: No   Disposition:  Disposition Initial Assessment Completed for this Encounter: Yes  [name], PA, patient meets inpatient criteria. TTS to secure placement. [name], RN, informed of disposition.   On Site Evaluation by:   Reviewed with Physician:    Burnetta Sabin, [facility] 11/25/2018 10:55 PM

## 2018-11-25 NOTE — ED Notes (Signed)
Bed: EM33 Expected date:  Expected time:  Means of arrival:  Comments: EMS OD, tachy, 30 pills yesterday

## 2018-11-25 NOTE — ED Notes (Signed)
On admission to room 31 his focus was on him not getting his regular HS meds that he" wouldn't sleep and what was he supposed to do all night watch TV?"  Called NP and she came to see him but couldn't arouse him and told writer to give his meds when he woke up but no need to wake him for them.She would not at this time order his Clonidine he is taking prior to admission because she doesn't feel comfortable with his overdose and his BP trending a bit low. She ordered prn Atarax if needed to help him sleep. He woke up shortly after she left the unit asking for his meds

## 2018-11-25 NOTE — ED Notes (Signed)
Per Caryn Bee at [employer],  Monitor patient for sedation, drowsiness, and respiratory depression.  Recommended to hold antidote.   Recommendations include: supportive care, BMP, acetaminophen level, and EKG.

## 2018-11-25 NOTE — Discharge Instructions (Addendum)
Notes from your ED Provider:   You will need your blood counts rechecked after your discharge.  We are providing you some resources to call to establish care for patients without insurance.  You have a new heart murmur.  You will need to have this followed as an outpatient.  Please contact the clinic above when you are discharged.  Heart murmurs and history of intravenous drug use can lead to infections of the heart.

## 2018-11-25 NOTE — ED Provider Notes (Addendum)
[facility]-EMERGENCY DEPT Provider Note   CSN: [id number] Arrival date & time: 11/25/18  1247    History   Chief Complaint Chief Complaint  Patient presents with  . Drug Overdose    HPI Spencer Rogers is a 23 y.o. male.     HPI  Patient is a 23 year old male with a history of bipolar 1 disorder, polysubstance use, PTSD presenting for overdose.  Patient reports that he was "trying to get high".  He denies any attempted self-harm.  Patient reports that he took approximately 20, 2 mg pills of etizolam between 2 and 4 AM.  He reports that at this time he also took approximately 4 g of kratom.  Patient reports that he was found by others in his recovery house were worried about him.  Patient reports that he believes "his bipolar is getting worse".  On evaluation patient in the room, he took 2 additional pills in the presence of providers, and admitted this.  He still continued denied this with self-harm, but reports that he is trying to self medicate.  Denies HI or AVH.  Patient denies any abdominal pain or vomiting.  Past Medical History:  Diagnosis Date  . Bipolar 1 disorder (HCC)   . History of drug overdose   . Polysubstance abuse (HCC)    opiates, benzos, cocaine, marijuana  . PTSD (post-traumatic stress disorder)   . Substance induced mood disorder [facility])     Patient Active Problem List   Diagnosis Date Noted  . Rhabdomyolysis 01/08/2017  . CAP (community acquired pneumonia) 01/08/2017  . 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 Transaminasemia 01/08/2017  . Accidental overdose of heroin (HCC)   . Lower extremity weakness   . Posttraumatic stress disorder 05/22/2014  . Suicidal ideations 05/22/2014  . Bereavement 05/22/2014  . Substance induced mood disorder (HCC) 05/22/2014  . Severe opioid use disorder (HCC) 05/22/2014  . Opiate overdose (HCC) 05/18/2014  . Cocaine abuse (HCC) 05/18/2014  . Cannabis abuse 05/18/2014  . Benzodiazepine  abuse (HCC) 05/18/2014    Past Surgical History:  Procedure Laterality Date  . unknown          Home Medications    Prior to Admission medications   Medication Sig Start Date End Date Taking? Authorizing Provider  clonazePAM (KLONOPIN) 0.5 MG tablet Take 0.5 mg by mouth 4 (four) times daily as needed for anxiety.    [provider]  cloNIDine (CATAPRES) 0.1 MG tablet Take 0.1 mg by mouth at bedtime.     [provider]  hydrOXYzine (ATARAX/VISTARIL) 25 MG tablet Take 1 tablet (25 mg) three times daily as needed: For anxiety Patient not taking: Reported on 05/27/2018 05/25/14   Armandina Stammer I, NP  lamoTRIgine (LAMICTAL) 200 MG tablet Take 200 mg by mouth at bedtime.     [provider]  naproxen (NAPROSYN) 500 MG tablet Take 1 tablet (500 mg total) by mouth 2 (two) times daily. Patient not taking: Reported on 05/27/2018 04/28/18   Sabas Sous, MD  risperidone (RISPERDAL) 4 MG tablet Take 4 mg by mouth at bedtime.     [provider]  traZODone (DESYREL) 50 MG tablet Take 1 tablet (50 mg total) by mouth at bedtime as needed for sleep. Patient not taking: Reported on 05/27/2018 05/25/14   Armandina Stammer I, NP    Family History Family History  Problem Relation Age of Onset  . Sudden Cardiac Death Neg Hx     Social History  Social History   Tobacco Use  . Smoking status: Current Every Day Smoker    Packs/day: 1.00    Years: 10.00    Pack years: 10.00    Types: Cigarettes  . Smokeless tobacco: Never Used  Substance Use Topics  . Alcohol use: Yes    Comment: occasionally   . Drug use: Yes    Types: Marijuana, Heroin, Benzodiazepines, IV, Cocaine    Comment: heroin, benzos     Allergies   Patient has no known allergies.   Review of Systems Review of Systems  Constitutional: Negative for chills and fever.  HENT: Negative for congestion and sore throat.   Eyes: Negative for visual disturbance.  Respiratory: Negative for cough, chest  tightness and shortness of breath.   Cardiovascular: Negative for chest pain.  Gastrointestinal: Negative for abdominal pain, nausea and vomiting.  Genitourinary: Negative for dysuria and flank pain.  Musculoskeletal: Negative for back pain and myalgias.  Skin: Negative for rash.  Neurological: Negative for dizziness, syncope, light-headedness and headaches.  Psychiatric/Behavioral: Positive for dysphoric mood. Negative for self-injury and suicidal ideas.     Physical Exam Updated Vital Signs BP 111/73 (BP Location: Right Arm)   Pulse (!) 130   Temp 100.3 F (37.9 C) (Oral)   Resp 18   Ht 5\' 6"  (1.676 m)   Wt 63.5 kg   SpO2 97%   BMI 22.60 kg/m   Physical Exam Vitals signs and nursing note reviewed.  Constitutional:      General: He is not in acute distress.    Appearance: He is well-developed.     Comments: Appears intoxicated, drowsy on exam.  HENT:     Head: Normocephalic and atraumatic.     Mouth/Throat:     Mouth: Mucous membranes are moist.  Eyes:     General:        Right eye: No discharge.        Left eye: No discharge.     Extraocular Movements: Extraocular movements intact.     Conjunctiva/sclera: Conjunctivae normal.     Pupils: Pupils are equal, round, and reactive to light.     Comments: +Dark circles under eyes.   Neck:     Musculoskeletal: Normal range of motion and neck supple.  Cardiovascular:     Rate and Rhythm: Normal rate and regular rhythm.     Heart sounds: S1 normal and S2 normal. Murmur present.     Comments: Soft systolic murmur.  Pulmonary:     Effort: Pulmonary effort is normal.     Breath sounds: Normal breath sounds. No wheezing or rales.  Abdominal:     General: There is no distension.     Palpations: Abdomen is soft.     Tenderness: There is no abdominal tenderness. There is no guarding.  Musculoskeletal: Normal range of motion.        General: No deformity.  Lymphadenopathy:     Cervical: No cervical adenopathy.  Skin:     General: Skin is warm and dry.     Findings: No erythema or rash.     Comments: No lesions noted on arms or legs.  Neurological:     Mental Status: He is alert.     Comments: Alert and oriented x4.  Speaks in full, clear sentences.  Answers questions appropriately.  Follows two-step commands. Cranial nerves grossly intact. Patient moves extremities symmetrically and with good coordination.  Psychiatric:     Comments: Normal affect.  Becomes agitated easily.  ED Treatments / Results  Labs (all labs ordered are listed, but only abnormal results are displayed) Labs Reviewed  COMPREHENSIVE METABOLIC PANEL - Abnormal; Notable for the following components:      Result Value   Glucose, Bld 103 (*)    Creatinine, Ser 1.27 (*)    Total Bilirubin 1.4 (*)    All other components within normal limits  ACETAMINOPHEN LEVEL - Abnormal; Notable for the following components:   Acetaminophen (Tylenol), Serum <10 (*)    All other components within normal limits  RAPID URINE DRUG SCREEN, HOSP PERFORMED - Abnormal; Notable for the following components:   Benzodiazepines POSITIVE (*)    All other components within normal limits  CBC WITH DIFFERENTIAL/PLATELET - Abnormal; Notable for the following components:   WBC 13.5 (*)    RDW 11.0 (*)    Platelets 140 (*)    Neutro Abs 11.0 (*)    Abs Immature Granulocytes 0.09 (*)    All other components within normal limits  SALICYLATE LEVEL  ETHANOL  CK  CBG MONITORING, ED    EKG EKG Interpretation  Date/Time:  Tuesday November 25 2018 13:01:59 EST Ventricular Rate:  128 PR Interval:    QRS Duration: 75 QT Interval:  288 QTC Calculation: 421 R Axis:   60 Text Interpretation:  Sinus tachycardia Left atrial enlargement RSR' in V1 or V2, probably normal variant similar to prior 7/19 Confirmed by Meridee Score [phone]) on 11/25/2018 1:07:37 PM   Radiology Dg Chest Portable 1 View  Result Date: 11/25/2018 CLINICAL DATA:  23 year old male  with a history of lethargy and confusion EXAM: PORTABLE CHEST 1 VIEW COMPARISON:  05/27/2018 FINDINGS: The heart size and mediastinal contours are within normal limits. Both lungs are clear. The visualized skeletal structures are unremarkable. IMPRESSION: Negative for acute cardiopulmonary disease Electronically Signed   By: Gilmer Mor D.O.   On: 11/25/2018 14:52    Procedures Procedures (including critical care time)  Medications Ordered in ED Medications  sodium chloride 0.9 % bolus 1,000 mL (has no administration in time range)    And  sodium chloride 0.9 % bolus 1,000 mL (has no administration in time range)    And  0.9 %  sodium chloride infusion (has no administration in time range)     Initial Impression / Assessment and Plan / ED Course  I have reviewed the triage vital signs and the nursing notes.  Pertinent labs & imaging results that were available during my care of the patient were reviewed by me and considered in my medical decision making (see chart for details).  Clinical Course as of Nov 26 1847  Tue Nov 25, 2018  2877 23 year old male brought in by ambulance from his recovery house for evaluation of impairment.  Patient states he has been taking some medication purchased over the Internet to get high.  He is not specifically state he is trying to hurt himself but he is clearly and appropriately using medication.  We are getting some basic lab work EKG and ultimately will have him evaluated by behavioral health.   [MB]  1400 IVC being completed    [AM]  1416 No evidence of rhabdomyolysis at this time. Will aggressively hydrate.   CK Total: 256 [AM]  1455 WBC(!): 13.5 [AM]    Clinical Course User Index [AM] Elisha Ponder, PA-C [MB] Terrilee Files, MD       Patient nontoxic-appearing in no acute distress on arrival.  He does appear drowsy.  He  was tachycardic on arrival.  Patient reporting ingestion of multiple substance, bought online, so unclear how pure  benzodiazepines were.  Unintentional overdose at this time.  He does have a history of suicidality.  Patient was initially threatening to leave AGAINST MEDICAL ADVICE, and also taking pills in the emergency department.  He is not of clear decision-making capacity on arrival, and given his active demonstration of self-harm, IVC was performed on this patient.  [employer] [place] poison control was contacted who recommends 6-hour observation, fluid resuscitation and supportive care.  Work-up demonstrating slight leukocytosis of 13.5 with left shift.  This is likely stress demargination.  Platelets are 140.  This is new thrombocytopenia.  Will have patient follow-up as an outpatient.  CK is 256, doubt rhabdomyolysis.  Patient has slight AKI of creatinine 1.27 BUN is normal.  Fluid resuscitation ordered.  No hypoglycemia.  Chest x-ray demonstrates no evidence of aspiration or infiltrate.  EKG is in sinus tachycardia on arrival with no evidence of abnormal intervals.  7:29 PM Poison control contacted.  Patient is medically cleared to speak with TTS.  Patient does have a murmur after heart rate has slowed and patient's exam was repeated.  Full skin exam was performed and no lesion suggestive endocarditis were noted.  He did have a temperature 100.3 on arrival, however rectal temp was performed which was normal.  No signs of septic emboli on chest x-ray.  I discussed at length with the patient that he will need to follow-up as an outpatient for echocardiogram.  Do not have clinical indication to medically admit patient for this work-up today.  He denies any IVDU x4 months.  No history of endocarditis. Plan of care discussed with current attending physician Dr. Bruce Donath.   Medications are ordered.  Patient is on clonidine, however his blood pressure is 105/67 here in the emergency department, and will defer at this time.  This is a shared visit with Dr. Meridee Score. Patient was independently evaluated by this  attending physician. Attending physician consulted in evaluation and management.  Final Clinical Impressions(s) / ED Diagnoses   Final diagnoses:  Drug overdose, undetermined intent, initial encounter  Thrombocytopenia [facility])  Newly recognized heart murmur    ED Discharge Orders    None       Delia Chimes 11/25/18 2047    Delia Chimes 11/25/18 2111    Terrilee Files, MD 11/26/18 [phone]

## 2018-11-26 ENCOUNTER — Inpatient Hospital Stay (HOSPITAL_COMMUNITY)
Admission: AD | Admit: 2018-11-26 | Discharge: 2018-12-01 | DRG: 885 | Disposition: A | Discharge status: Home / Self Care | Payer: Federal, State, Local not specified - Other | Source: Intra-hospital | Attending: Psychiatry | Admitting: Psychiatry

## 2018-11-26 ENCOUNTER — Encounter (HOSPITAL_COMMUNITY): Payer: Self-pay | Admitting: *Deleted

## 2018-11-26 ENCOUNTER — Other Ambulatory Visit: Payer: Self-pay

## 2018-11-26 DIAGNOSIS — Z23 Encounter for immunization: Secondary | ICD-10-CM | POA: Diagnosis not present

## 2018-11-26 DIAGNOSIS — F17210 Nicotine dependence, cigarettes, uncomplicated: Secondary | ICD-10-CM | POA: Diagnosis present

## 2018-11-26 DIAGNOSIS — F1120 Opioid dependence, uncomplicated: Secondary | ICD-10-CM | POA: Diagnosis present

## 2018-11-26 DIAGNOSIS — Z915 Personal history of self-harm: Secondary | ICD-10-CM

## 2018-11-26 DIAGNOSIS — F1994 Other psychoactive substance use, unspecified with psychoactive substance-induced mood disorder: Secondary | ICD-10-CM | POA: Diagnosis present

## 2018-11-26 DIAGNOSIS — F1310 Sedative, hypnotic or anxiolytic abuse, uncomplicated: Secondary | ICD-10-CM | POA: Diagnosis present

## 2018-11-26 DIAGNOSIS — T424X1S Poisoning by benzodiazepines, accidental (unintentional), sequela: Secondary | ICD-10-CM | POA: Diagnosis not present

## 2018-11-26 DIAGNOSIS — Z818 Family history of other mental and behavioral disorders: Secondary | ICD-10-CM | POA: Diagnosis not present

## 2018-11-26 DIAGNOSIS — Z79899 Other long term (current) drug therapy: Secondary | ICD-10-CM | POA: Diagnosis not present

## 2018-11-26 DIAGNOSIS — F4310 Post-traumatic stress disorder, unspecified: Secondary | ICD-10-CM | POA: Diagnosis present

## 2018-11-26 DIAGNOSIS — Z8659 Personal history of other mental and behavioral disorders: Secondary | ICD-10-CM

## 2018-11-26 DIAGNOSIS — Z791 Long term (current) use of non-steroidal anti-inflammatories (NSAID): Secondary | ICD-10-CM

## 2018-11-26 DIAGNOSIS — G4700 Insomnia, unspecified: Secondary | ICD-10-CM | POA: Diagnosis present

## 2018-11-26 DIAGNOSIS — F13239 Sedative, hypnotic or anxiolytic dependence with withdrawal, unspecified: Secondary | ICD-10-CM | POA: Diagnosis present

## 2018-11-26 DIAGNOSIS — F1123 Opioid dependence with withdrawal: Secondary | ICD-10-CM | POA: Diagnosis present

## 2018-11-26 DIAGNOSIS — F332 Major depressive disorder, recurrent severe without psychotic features: Principal | ICD-10-CM | POA: Diagnosis present

## 2018-11-26 LAB — CK: Total CK: 124 U/L (ref 49–397)

## 2018-11-26 MED ORDER — CLONIDINE HCL 0.1 MG PO TABS
0.1000 mg | ORAL_TABLET | Freq: Every day | ORAL | Status: DC
  Filled 2018-11-26 (×2): qty 1

## 2018-11-26 MED ORDER — HYDROXYZINE HCL 25 MG PO TABS
25.0000 mg | ORAL_TABLET | Freq: Four times a day (QID) | ORAL | Status: AC | PRN
  Administered 2018-11-26 – 2018-11-30 (×7): 25 mg via ORAL
  Filled 2018-11-26 (×5): qty 1
  Filled 2018-11-26: qty 10
  Filled 2018-11-26 (×2): qty 1

## 2018-11-26 MED ORDER — PNEUMOCOCCAL VAC POLYVALENT 25 MCG/0.5ML IJ INJ
0.5000 mL | INJECTION | INTRAMUSCULAR | Status: AC
  Administered 2018-11-27: 0.5 mL via INTRAMUSCULAR

## 2018-11-26 MED ORDER — ONDANSETRON 4 MG PO TBDP
4.0000 mg | ORAL_TABLET | Freq: Four times a day (QID) | ORAL | Status: AC | PRN
  Administered 2018-11-26: 4 mg via ORAL
  Filled 2018-11-26 (×2): qty 1

## 2018-11-26 MED ORDER — NICOTINE POLACRILEX 2 MG MT GUM
2.0000 mg | CHEWING_GUM | OROMUCOSAL | Status: DC | PRN
  Administered 2018-11-27 – 2018-12-01 (×14): 2 mg via ORAL
  Filled 2018-11-26 (×4): qty 1

## 2018-11-26 MED ORDER — CLONIDINE HCL 0.1 MG PO TABS
0.1000 mg | ORAL_TABLET | Freq: Four times a day (QID) | ORAL | Status: AC
  Administered 2018-11-26 – 2018-11-27 (×4): 0.1 mg via ORAL
  Filled 2018-11-26 (×9): qty 1

## 2018-11-26 MED ORDER — CLONIDINE HCL 0.1 MG PO TABS
0.1000 mg | ORAL_TABLET | ORAL | Status: AC
  Administered 2018-11-28 – 2018-11-30 (×4): 0.1 mg via ORAL
  Filled 2018-11-26 (×4): qty 1

## 2018-11-26 MED ORDER — MAGNESIUM HYDROXIDE 400 MG/5ML PO SUSP: 30.0000 mL | Freq: Every day | ORAL | Status: DC | PRN

## 2018-11-26 MED ORDER — INFLUENZA VAC SPLIT QUAD 0.5 ML IM SUSY
0.5000 mL | PREFILLED_SYRINGE | INTRAMUSCULAR | Status: AC
  Administered 2018-11-27: 0.5 mL via INTRAMUSCULAR
  Filled 2018-11-26: qty 0.5

## 2018-11-26 MED ORDER — ENSURE ENLIVE PO LIQD
237.0000 mL | Freq: Two times a day (BID) | ORAL | Status: DC
  Administered 2018-11-26 – 2018-11-30 (×9): 237 mL via ORAL

## 2018-11-26 MED ORDER — NAPROXEN 500 MG PO TABS
500.0000 mg | ORAL_TABLET | Freq: Two times a day (BID) | ORAL | Status: AC | PRN
  Administered 2018-11-26 – 2018-11-29 (×4): 500 mg via ORAL
  Filled 2018-11-26 (×4): qty 1

## 2018-11-26 MED ORDER — CHLORDIAZEPOXIDE HCL 25 MG PO CAPS
25.0000 mg | ORAL_CAPSULE | Freq: Four times a day (QID) | ORAL | Status: DC | PRN
  Administered 2018-11-26 (×2): 25 mg via ORAL
  Filled 2018-11-26 (×2): qty 1
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
## 2018-11-26 NOTE — Progress Notes (Signed)
Recreation Therapy Notes  Date:  2.26.20 Time: 0930 Location: 300 Hall Dayroom  Group Topic: Stress Management  Goal Area(s) Addresses:  Patient will identify positive stress management techniques. Patient will identify benefits of using stress management post d/c.  Intervention:  Stress Management    Activity :  Guided Imagery.  LRT introduced the stress management technique of guided imagery.  LRT read a script that focused on relaxing under the summer clouds.  Patients were to listen in order to engage as script was read.    Education:  Stress Management, Discharge Planning.   Education Outcome: Acknowledges Education  Clinical Observations/Feedback:  Pt did not attend group.     Caroll Rancher,  LRT/CTRS         Caroll Rancher A 11/26/2018 11:32 AM

## 2018-11-26 NOTE — ED Notes (Signed)
Report called to Irving Burton RN at [facility], patient accepted to room 302-2. He was sleeping soundly prior to transport arriving and told he would be going but unclear if he is comprehending this information. He did grunt in response to Clinical research associate telling him he is going. IVC paperwork accompanied him along with two [employer] officers.

## 2018-11-26 NOTE — ED Notes (Signed)
[name], PA, patient meets inpatient criteria.  Spencer Rogers, patient accepted to [facility] Room 302 Bed 2. Patient can be transported now. Accepting is Dr. Jola Babinski. Nurse to nurse report to [facility] [phone]. [name], RN, informed of acceptance.

## 2018-11-26 NOTE — ED Notes (Signed)
Per TTS patient has been accepted by Dr Tamera Punt at [facility] for admission to 302 bed 2 tonight. Dr Lynelle Doctor notified he has been accepted but she is unable to set him for discharge at this time she will when she frees up from her current responsibilities. Report called to [facility].

## 2018-11-26 NOTE — Plan of Care (Signed)
  Problem: Education: Goal: Mental status will improve Outcome: Progressing   D: Pt alert and oriented on the unit. Pt denies SI/HI, A/VH. Pt's affect was flat, and he was isolative in his room asleep during the day. Pt took medications and ate his meals in his room. Pt is pleasant and cooperative.  A: Education, support and encouragement provided, q15 minute safety checks remain in effect. Medications administered per MD orders.  R: No reactions/side effects to medicine noted. Pt denies any concerns at this time, and verbally contracts for safety.Pt ambulating on the unit with no issues. Pt remains safe on the unit.

## 2018-11-26 NOTE — Tx Team (Signed)
Initial Treatment Plan 11/26/2018 3:54 AM Sabino Niemann CWU:[id number]    PATIENT STRESSORS: Financial difficulties Substance abuse   PATIENT STRENGTHS: Average or above average intelligence General fund of knowledge Supportive family/friends   PATIENT IDENTIFIED PROBLEMS: "not being suicidal"   "I need to get clean and a safe place to do it"     Substance abuse  depression             DISCHARGE CRITERIA:  Adequate post-discharge living arrangements Improved stabilization in mood, thinking, and/or behavior  PRELIMINARY DISCHARGE PLAN: Outpatient therapy Placement in alternative living arrangements  PATIENT/FAMILY INVOLVEMENT: This treatment plan has been presented to and reviewed with the patient, Spencer Rogers.  The patient and family have been given the opportunity to ask questions and make suggestions.  Arrie Aran, [place] 11/26/2018, 3:54 AM

## 2018-11-26 NOTE — Progress Notes (Signed)
Psychoeducational Group Note  Date:  11/26/2018 Time:  2127  Group Topic/Focus:  Wrap-Up Group:   The focus of this group is to help patients review their daily goal of treatment and discuss progress on daily workbooks.  Participation Level: Did Not Attend  Participation Quality:  Not Applicable  Affect:  Not Applicable  Cognitive:  Not Applicable  Insight:  Not Applicable  Engagement in Group: Not Applicable  Additional Comments:  The patient did not attend group this evening since he was asleep in his bedroom.  Hazle Coca S 11/26/2018, 9:27 PM

## 2018-11-26 NOTE — Progress Notes (Signed)
CSW attempted to complete PSA with patient twice. Patient sleeping soundly and did not respond to his name being called or knocking at the door.  CSW will follow up with patient tomorrow morning to complete PSA  Enid Cutter, LCSW-A Clinical Social Worker

## 2018-11-26 NOTE — ED Provider Notes (Signed)
Patient accepted at [facility], Jodelle Gross, MD 11/26/18 [phone]

## 2018-11-26 NOTE — H&P (Signed)
Psychiatric Admission Assessment Adult  Patient Identification: Spencer Rogers MRN:  [id number] Date of Evaluation:  11/26/2018 Chief Complaint:  Bipolar disorder Polysubstance abuse MDD Principal Diagnosis: <principal problem not specified> Diagnosis:  Active Problems:   MDD (major depressive disorder), recurrent episode, severe (HCC)  History of Present Illness: Patient is seen and examined.  Patient is a 23 year old male who was involuntarily committed from the [facility] emergency department after taking approximately 30 tablets of benzodiazepines.  He stated those were estazolam.  He stated he obtain these over the Internet.  He was staying at an [facility], and his roommates found him stumbling over things.  EMS was called.  Patient had recently gone to  [facility], and then [place] [facility] following that.  He reportedly had had 6 months of sobriety up until yesterday.  He stated that his trigger was secondary to increased work stress.  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  He also admitted to using kratom.  He stated his last detox was in 2019 at [facility].  His last psychiatric hospitalization at our facility was in 2015.  He was diagnosed at that time with severe opioid use disorder.  There is also some mention of posttraumatic stress disorder.  He was discharged on hydroxyzine and trazodone at that time.  Currently he still significantly sedated, and is not a great historian.  A note from 01/29/2018 from a visit at [facility] health showed him taking clonidine, gabapentin, Lamictal, Phenergan, Risperdal and MiraLAX.  He was admitted to the hospital for evaluation and stabilization.  Associated Signs/Symptoms: Depression Symptoms:  depressed mood, anhedonia, insomnia, (Hypo) Manic Symptoms:  Impulsivity, Anxiety Symptoms:  Excessive Worry, Psychotic Symptoms:  Denied PTSD Symptoms: Negative Total Time spent with patient: 30 minutes  Past Psychiatric History: Patient stated  that he had been in a detox program with the local mental health center and then went to [facility].  After that he went to [place] [facility].  His last psychiatric hospitalization at our facility was 5 years ago.  From review of the electronic medical record it appears as though he has been getting medications from a psychiatrist, but he is significantly sedated this a.m. and hard to get a complete history.  Is the patient at risk to self? Yes.    Has the patient been a risk to self in the past 6 months? No.  Has the patient been a risk to self within the distant past? No.  Is the patient a risk to others? No.  Has the patient been a risk to others in the past 6 months? No.  Has the patient been a risk to others within the distant past? No.   Prior Inpatient Therapy:   Prior Outpatient Therapy:    Alcohol Screening: 1. How often do you have a drink containing alcohol?: Never 2. How many drinks containing alcohol do you have on a typical day when you are drinking?: 1 or 2 3. How often do you have six or more drinks on one occasion?: Never AUDIT-C Score: 0 4. How often during the last year have you found that you were not able to stop drinking once you had started?: Never 5. How often during the last year have you failed to do what was normally expected from you becasue of drinking?: Never 6. How often during the last year have you needed a first drink in the morning to get yourself going after a heavy drinking session?: Never 7. How often during  the last year have you had a feeling of guilt of remorse after drinking?: Never 8. How often during the last year have you been unable to remember what happened the night before because you had been drinking?: Never 9. Have you or someone else been injured as a result of your drinking?: No 10. Has a relative or friend or a doctor or another health worker been concerned about your drinking or suggested you cut down?: No Alcohol Use Disorder Identification Test  Final Score (AUDIT): 0 Alcohol Brief Interventions/Follow-up: AUDIT Score <7 follow-up not indicated Substance Abuse History in the last 12 months:  Yes.   Consequences of Substance Abuse: Medical Consequences:  Previous detox, and this current hospitalization secondary to an intentional overdose of benzodiazepines. Blackouts:  He has reported blackouts in the past. Withdrawal Symptoms:   Cramps Diarrhea Headaches Nausea Tremors Previous Psychotropic Medications: Yes  Psychological Evaluations: Yes  Past Medical History:  Past Medical History:  Diagnosis Date  . Bipolar 1 disorder (HCC)   . History of drug overdose   . Polysubstance abuse (HCC)    opiates, benzos, cocaine, marijuana  . PTSD (post-traumatic stress disorder)   . Substance induced mood disorder [facility])     Past Surgical History:  Procedure Laterality Date  . unknown     Family History:  Family History  Problem Relation Age of Onset  . Sudden Cardiac Death Neg Hx    Family Psychiatric  History: No pertinent family history reported by the patient. Tobacco Screening: Have you used any form of tobacco in the last 30 days? (Cigarettes, Smokeless Tobacco, Cigars, and/or Pipes): Yes Tobacco use, Select all that apply: 5 or more cigarettes per day Are you interested in Tobacco Cessation Medications?: Yes, will notify MD for an order Counseled patient on smoking cessation including recognizing danger situations, developing coping skills and basic information about quitting provided: Yes Social History:  Social History   Substance and Sexual Activity  Alcohol Use Not Currently   Comment: occasionally      Social History   Substance and Sexual Activity  Drug Use Yes  . Types: Marijuana, Heroin, Benzodiazepines, IV, Cocaine   Comment: heroin, benzos    Additional Social History:      Pain Medications: denies Prescriptions: overdosed on benzodiazepine per pt Over the Counter: kratom per report History of  alcohol / drug use?: Yes Longest period of sobriety (when/how long): unknown Negative Consequences of Use: Financial, Personal relationships Withdrawal Symptoms: Sweats, Other (Comment)(slurred speech, delayed response)                    Allergies:   Allergies  Allergen Reactions  . Seroquel [Quetiapine Fumarate] Other (See Comments)    Pt said, I can't take Seroquel, it makes me want to kill people.   Lab Results:  Results for orders placed or performed during the hospital encounter of 11/25/18 (from the past 48 hour(s))  Comprehensive metabolic panel     Status: Abnormal   Collection Time: 11/25/18  1:30 PM  Result Value Ref Range   Sodium 140 135 - 145 mmol/L   Potassium 3.6 3.5 - 5.1 mmol/L   Chloride 108 98 - 111 mmol/L   CO2 25 22 - 32 mmol/L   Glucose, Bld 103 (H) 70 - 99 mg/dL   BUN 17 6 - 20 mg/dL   Creatinine, Ser 4.09 (H) 0.61 - 1.24 mg/dL   Calcium 9.1 8.9 - 81.1 mg/dL   Total Protein 6.9 6.5 - 8.1  g/dL   Albumin 4.6 3.5 - 5.0 g/dL   AST 20 15 - 41 U/L   ALT 11 0 - 44 U/L   Alkaline Phosphatase 59 38 - 126 U/L   Total Bilirubin 1.4 (H) 0.3 - 1.2 mg/dL   GFR calc non Af Amer >60 >60 mL/min   GFR calc Af Amer >60 >60 mL/min   Anion gap 7 5 - 15    Comment: Performed at [facility], [address], [place], [place] [address]  Salicylate level     Status: None   Collection Time: 11/25/18  1:30 PM  Result Value Ref Range   Salicylate Lvl <7.0 2.8 - 30.0 mg/dL    Comment: Performed at [facility], [address], [place], [place] [address]  Acetaminophen level     Status: Abnormal   Collection Time: 11/25/18  1:30 PM  Result Value Ref Range   Acetaminophen (Tylenol), Serum <10 (L) 10 - 30 ug/mL    Comment: (NOTE) Therapeutic concentrations vary significantly. A range of 10-30 ug/mL  may be an effective concentration for many patients. However, some  are best treated at concentrations outside of this  range. Acetaminophen concentrations >150 ug/mL at 4 hours after ingestion  and >50 ug/mL at 12 hours after ingestion are often associated with  toxic reactions. Performed at [facility], [address], [place], [place] [address]   Ethanol     Status: None   Collection Time: 11/25/18  1:30 PM  Result Value Ref Range   Alcohol, Ethyl (B) <10 <10 mg/dL    Comment: (NOTE) Lowest detectable limit for serum alcohol is 10 mg/dL. For medical purposes only. Performed at [facility], [address] [place], [place] [address]   CBC WITH DIFFERENTIAL     Status: Abnormal   Collection Time: 11/25/18  1:30 PM  Result Value Ref Range   WBC 13.5 (H) 4.0 - 10.5 K/uL   RBC 4.76 4.22 - 5.81 MIL/uL   Hemoglobin 14.6 13.0 - 17.0 g/dL   HCT 29.5 62.1 - 30.8 %   MCV 93.5 80.0 - 100.0 fL   MCH 30.7 26.0 - 34.0 pg   MCHC 32.8 30.0 - 36.0 g/dL   RDW 65.7 (L) 84.6 - 96.2 %   Platelets 140 (L) 150 - 400 K/uL    Comment: REPEATED TO VERIFY PLATELET COUNT CONFIRMED BY SMEAR    nRBC 0.0 0.0 - 0.2 %   Neutrophils Relative % 81 %   Neutro Abs 11.0 (H) 1.7 - 7.7 K/uL   Lymphocytes Relative 10 %   Lymphs Abs 1.3 0.7 - 4.0 K/uL   Monocytes Relative 8 %   Monocytes Absolute 1.0 0.1 - 1.0 K/uL   Eosinophils Relative 0 %   Eosinophils Absolute 0.0 0.0 - 0.5 K/uL   Basophils Relative 0 %   Basophils Absolute 0.0 0.0 - 0.1 K/uL   WBC Morphology MILD LEFT SHIFT (1-5% METAS, OCC MYELO, OCC BANDS)    Immature Granulocytes 1 %   Abs Immature Granulocytes 0.09 (H) 0.00 - 0.07 K/uL    Comment: Performed at [facility], [address], [place], [place] [address]  CK     Status: None   Collection Time: 11/25/18  1:30 PM  Result Value Ref Range   Total CK 256 49 - 397 U/L    Comment: Performed at [facility], [address] [place], [place] [address]  CBG monitoring, ED  Status: None   Collection Time: 11/25/18  1:36 PM  Result  Value Ref Range   Glucose-Capillary 98 70 - 99 mg/dL  Urine rapid drug screen (hosp performed)     Status: Abnormal   Collection Time: 11/25/18  1:47 PM  Result Value Ref Range   Opiates NONE DETECTED NONE DETECTED   Cocaine NONE DETECTED NONE DETECTED   Benzodiazepines POSITIVE (A) NONE DETECTED   Amphetamines NONE DETECTED NONE DETECTED   Tetrahydrocannabinol NONE DETECTED NONE DETECTED   Barbiturates NONE DETECTED NONE DETECTED    Comment: (NOTE) DRUG SCREEN FOR MEDICAL PURPOSES ONLY.  IF CONFIRMATION IS NEEDED FOR ANY PURPOSE, NOTIFY LAB WITHIN 5 DAYS. LOWEST DETECTABLE LIMITS FOR URINE DRUG SCREEN Drug Class                     Cutoff (ng/mL) Amphetamine and metabolites    1000 Barbiturate and metabolites    200 Benzodiazepine                 200 Tricyclics and metabolites     300 Opiates and metabolites        300 Cocaine and metabolites        300 THC                            50 Performed at [facility], [address], [place], [place] [address]     Blood Alcohol level:  Lab Results  Component Value Date   ETH <10 11/25/2018   ETH <10 05/27/2018    Metabolic Disorder Labs:  No results found for: HGBA1C, MPG No results found for: PROLACTIN No results found for: CHOL, TRIG, HDL, CHOLHDL, VLDL, LDLCALC  Current Medications: Current Facility-Administered Medications  Medication Dose Route Frequency Provider Last Rate Last Dose  . chlordiazePOXIDE (LIBRIUM) capsule 25 mg  25 mg Oral QID PRN Antonieta Pert, MD   25 mg at 11/26/18 6384  . cloNIDine (CATAPRES) tablet 0.1 mg  0.1 mg Oral QID Antonieta Pert, MD   0.1 mg at 11/26/18 5364   Followed by  . [START ON 11/28/2018] cloNIDine (CATAPRES) tablet 0.1 mg  0.1 mg Oral BH-qamhs Clary, Marlane Mingle, MD       Followed by  . [START ON 11/30/2018] cloNIDine (CATAPRES) tablet 0.1 mg  0.1 mg Oral QAC breakfast Antonieta Pert, MD      . feeding supplement (ENSURE ENLIVE) (ENSURE ENLIVE)  liquid 237 mL  237 mL Oral BID BM Antonieta Pert, MD   237 mL at 11/26/18 0948  . hydrOXYzine (ATARAX/VISTARIL) tablet 25 mg  25 mg Oral Q6H PRN Antonieta Pert, MD   25 mg at 11/26/18 6803  . [START ON 11/27/2018] Influenza vac split quadrivalent PF (FLUARIX) injection 0.5 mL  0.5 mL Intramuscular Tomorrow-1000 Antonieta Pert, MD      . magnesium hydroxide (MILK OF MAGNESIA) suspension 30 mL  30 mL Oral Daily PRN [name] E, PA-C      . naproxen (NAPROSYN) tablet 500 mg  500 mg Oral BID PRN Antonieta Pert, MD   500 mg at 11/26/18 2122  . nicotine polacrilex (NICORETTE) gum 2 mg  2 mg Oral PRN Antonieta Pert, MD      . ondansetron (ZOFRAN-ODT) disintegrating tablet 4 mg  4 mg Oral Q6H PRN Antonieta Pert, MD   4 mg at 11/26/18 4825  . [START ON 11/27/2018]  pneumococcal 23 valent vaccine (PNU-IMMUNE) injection 0.5 mL  0.5 mL Intramuscular Tomorrow-1000 Jola Babinski Marlane Mingle, MD       PTA Medications: Medications Prior to Admission  Medication Sig Dispense Refill Last Dose  . cloNIDine (CATAPRES) 0.1 MG tablet Take 0.1 mg by mouth at bedtime.    11/24/2018 at Unknown time  . hydrOXYzine (ATARAX/VISTARIL) 25 MG tablet Take 1 tablet (25 mg) three times daily as needed: For anxiety (Patient not taking: Reported on 11/25/2018) 45 tablet 0 Not Taking at Unknown time  . lamoTRIgine (LAMICTAL) 200 MG tablet Take 100-200 mg by mouth See admin instructions. 100 mg in the am and 200 mg at bedtime   11/25/2018 at Unknown time  . naproxen (NAPROSYN) 500 MG tablet Take 1 tablet (500 mg total) by mouth 2 (two) times daily. (Patient not taking: Reported on 11/25/2018) 30 tablet 0 Not Taking at Unknown time  . risperidone (RISPERDAL) 4 MG tablet Take 4 mg by mouth at bedtime.    11/24/2018 at Unknown time  . traZODone (DESYREL) 50 MG tablet Take 1 tablet (50 mg total) by mouth at bedtime as needed for sleep. (Patient not taking: Reported on 11/25/2018) 30 tablet 0 Not Taking at Unknown time     Musculoskeletal: Strength & Muscle Tone: decreased Gait & Station: unsteady Patient leans: N/A  Psychiatric Specialty Exam: Physical Exam  Nursing note and vitals reviewed. Constitutional: He appears well-developed and well-nourished.  HENT:  Head: Normocephalic and atraumatic.  Respiratory: Effort normal.  Neurological: He is alert.    ROS  Blood pressure 122/82, pulse (!) 102, temperature (!) 97.4 F (36.3 C), temperature source Oral, resp. rate 18, height  (1.676 m), weight 58.3 kg.Body mass index is 20.74 kg/m.  General Appearance: Disheveled  Eye Contact:  Poor  Speech:  Slow  Volume:  Decreased  Mood:  Dysphoric  Affect:  Congruent  Thought Process:  Coherent and Descriptions of Associations: Circumstantial  Orientation:  Full (Time, Place, and Person)  Thought Content:  Logical  Suicidal Thoughts:  No  Homicidal Thoughts:  No  Memory:  Immediate;   Poor Recent;   Poor Remote;   Poor  Judgement:  Impaired  Insight:  Lacking  Psychomotor Activity:  Psychomotor Retardation  Concentration:  Concentration: Poor and Attention Span: Poor  Recall:  Poor  Fund of Knowledge:  Fair  Language:  Fair  Akathisia:  Negative  Handed:  Right  AIMS (if indicated):     Assets:  Desire for Improvement Leisure Time Resilience  ADL's:  Intact  Cognition:  WNL  Sleep:  Number of Hours: 1.5(new admit )    Treatment Plan Summary: Daily contact with patient to assess and evaluate symptoms and progress in treatment, Medication management and Plan : Patient is seen and examined.  Patient is a 23 year old male with the above-stated past psychiatric history was placed under involuntary commitment transferred to our facility secondary to an intentional overdose of benzodiazepines.  He will be admitted to the unit.  He will be integrated into the milieu.  Given the kratom and benzodiazepines he will be placed on Librium 25 mg p.o. 4 times daily as needed a CIWA greater than 10.   He will also be placed on the clonidine detox protocol for opiates.  His CK in the emergency room was mildly elevated at 256.  His white count is mildly elevated at 13.5.  His creatinine is mildly elevated at 1.27.  I am going to repeat his CK given his complaint  of generalized muscle pain.  I am also going to check his urine for urine myoglobin to make sure there is no degree of rhabdo.  Additionally his platelets are low at 140,000, and these will be monitored.  He is mildly tachycardic this morning at a rate of 102.  His blood pressure stable at 116/76.  His oxygen saturation was 97% on room air.  Observation Level/Precautions:  Detox 15 minute checks  Laboratory:  Chemistry Profile  Psychotherapy:    Medications:    Consultations:    Discharge Concerns:    Estimated LOS:  Other:     Physician Treatment Plan for Primary Diagnosis: <principal problem not specified> Long Term Goal(s): Improvement in symptoms so as ready for discharge  Short Term Goals: Ability to identify changes in lifestyle to reduce recurrence of condition will improve, Ability to verbalize feelings will improve, Ability to disclose and discuss suicidal ideas, Ability to demonstrate self-control will improve, Ability to identify and develop effective coping behaviors will improve, Ability to maintain clinical measurements within normal limits will improve, Compliance with prescribed medications will improve and Ability to identify triggers associated with substance abuse/mental health issues will improve  Physician Treatment Plan for Secondary Diagnosis: Active Problems:   MDD (major depressive disorder), recurrent episode, severe (HCC)  Long Term Goal(s): Improvement in symptoms so as ready for discharge  Short Term Goals: Ability to identify changes in lifestyle to reduce recurrence of condition will improve, Ability to verbalize feelings will improve, Ability to disclose and discuss suicidal ideas, Ability to demonstrate  self-control will improve, Ability to identify and develop effective coping behaviors will improve, Ability to maintain clinical measurements within normal limits will improve, Compliance with prescribed medications will improve and Ability to identify triggers associated with substance abuse/mental health issues will improve  I certify that inpatient services furnished can reasonably be expected to improve the patient's condition.    Antonieta PertGreg Lawson Clary, MD 2/26/202011:07 AM

## 2018-11-26 NOTE — Tx Team (Signed)
Interdisciplinary Treatment and Diagnostic Plan Update  11/26/2018 Time of Session: 10:00am Spencer Rogers MRN: [id number]  Principal Diagnosis: <principal problem not specified>  Secondary Diagnoses: Active Problems:   MDD (major depressive disorder), recurrent episode, severe (HCC)   Current Medications:  Current Facility-Administered Medications  Medication Dose Route Frequency Provider Last Rate Last Dose  . chlordiazePOXIDE (LIBRIUM) capsule 25 mg  25 mg Oral QID PRN Sharma Covert, MD   25 mg at 11/26/18 4650  . cloNIDine (CATAPRES) tablet 0.1 mg  0.1 mg Oral QID Sharma Covert, MD   Stopped at 11/26/18 1204   Followed by  . [START ON 11/28/2018] cloNIDine (CATAPRES) tablet 0.1 mg  0.1 mg Oral BH-qamhs Clary, Cordie Grice, MD       Followed by  . [START ON 11/30/2018] cloNIDine (CATAPRES) tablet 0.1 mg  0.1 mg Oral QAC breakfast Sharma Covert, MD      . feeding supplement (ENSURE ENLIVE) (ENSURE ENLIVE) liquid 237 mL  237 mL Oral BID BM Sharma Covert, MD   237 mL at 11/26/18 0948  . hydrOXYzine (ATARAX/VISTARIL) tablet 25 mg  25 mg Oral Q6H PRN Sharma Covert, MD   25 mg at 11/26/18 3546  . [START ON 11/27/2018] Influenza vac split quadrivalent PF (FLUARIX) injection 0.5 mL  0.5 mL Intramuscular Tomorrow-1000 Sharma Covert, MD      . magnesium hydroxide (MILK OF MAGNESIA) suspension 30 mL  30 mL Oral Daily PRN Patriciaann Clan E, PA-C      . naproxen (NAPROSYN) tablet 500 mg  500 mg Oral BID PRN Sharma Covert, MD   500 mg at 11/26/18 5681  . nicotine polacrilex (NICORETTE) gum 2 mg  2 mg Oral PRN Sharma Covert, MD      . ondansetron (ZOFRAN-ODT) disintegrating tablet 4 mg  4 mg Oral Q6H PRN Sharma Covert, MD   4 mg at 11/26/18 2751  . [START ON 11/27/2018] pneumococcal 23 valent vaccine (PNU-IMMUNE) injection 0.5 mL  0.5 mL Intramuscular Tomorrow-1000 Mallie Darting Cordie Grice, MD       PTA Medications: Medications Prior to Admission  Medication Sig  Dispense Refill Last Dose  . cloNIDine (CATAPRES) 0.1 MG tablet Take 0.1 mg by mouth at bedtime.    11/24/2018 at Unknown time  . hydrOXYzine (ATARAX/VISTARIL) 25 MG tablet Take 1 tablet (25 mg) three times daily as needed: For anxiety (Patient not taking: Reported on 11/25/2018) 45 tablet 0 Not Taking at Unknown time  . lamoTRIgine (LAMICTAL) 200 MG tablet Take 100-200 mg by mouth See admin instructions. 100 mg in the am and 200 mg at bedtime   11/25/2018 at Unknown time  . naproxen (NAPROSYN) 500 MG tablet Take 1 tablet (500 mg total) by mouth 2 (two) times daily. (Patient not taking: Reported on 11/25/2018) 30 tablet 0 Not Taking at Unknown time  . risperidone (RISPERDAL) 4 MG tablet Take 4 mg by mouth at bedtime.    11/24/2018 at Unknown time  . traZODone (DESYREL) 50 MG tablet Take 1 tablet (50 mg total) by mouth at bedtime as needed for sleep. (Patient not taking: Reported on 11/25/2018) 30 tablet 0 Not Taking at Unknown time    Patient Stressors: Financial difficulties Substance abuse  Patient Strengths: Average or above average intelligence General fund of knowledge Supportive family/friends  Treatment Modalities: Medication Management, Group therapy, Case management,  1 to 1 session with clinician, Psychoeducation, Recreational therapy.   Physician Treatment Plan for Primary Diagnosis: <principal problem not  specified> Long Term Goal(s): Improvement in symptoms so as ready for discharge Improvement in symptoms so as ready for discharge   Short Term Goals: Ability to identify changes in lifestyle to reduce recurrence of condition will improve Ability to verbalize feelings will improve Ability to disclose and discuss suicidal ideas Ability to demonstrate self-control will improve Ability to identify and develop effective coping behaviors will improve Ability to maintain clinical measurements within normal limits will improve Compliance with prescribed medications will improve Ability  to identify triggers associated with substance abuse/mental health issues will improve Ability to identify changes in lifestyle to reduce recurrence of condition will improve Ability to verbalize feelings will improve Ability to disclose and discuss suicidal ideas Ability to demonstrate self-control will improve Ability to identify and develop effective coping behaviors will improve Ability to maintain clinical measurements within normal limits will improve Compliance with prescribed medications will improve Ability to identify triggers associated with substance abuse/mental health issues will improve  Medication Management: Evaluate patient's response, side effects, and tolerance of medication regimen.  Therapeutic Interventions: 1 to 1 sessions, Unit Group sessions and Medication administration.  Evaluation of Outcomes: Not Met  Physician Treatment Plan for Secondary Diagnosis: Active Problems:   MDD (major depressive disorder), recurrent episode, severe ([place])  Long Term Goal(s): Improvement in symptoms so as ready for discharge Improvement in symptoms so as ready for discharge   Short Term Goals: Ability to identify changes in lifestyle to reduce recurrence of condition will improve Ability to verbalize feelings will improve Ability to disclose and discuss suicidal ideas Ability to demonstrate self-control will improve Ability to identify and develop effective coping behaviors will improve Ability to maintain clinical measurements within normal limits will improve Compliance with prescribed medications will improve Ability to identify triggers associated with substance abuse/mental health issues will improve Ability to identify changes in lifestyle to reduce recurrence of condition will improve Ability to verbalize feelings will improve Ability to disclose and discuss suicidal ideas Ability to demonstrate self-control will improve Ability to identify and develop effective coping  behaviors will improve Ability to maintain clinical measurements within normal limits will improve Compliance with prescribed medications will improve Ability to identify triggers associated with substance abuse/mental health issues will improve     Medication Management: Evaluate patient's response, side effects, and tolerance of medication regimen.  Therapeutic Interventions: 1 to 1 sessions, Unit Group sessions and Medication administration.  Evaluation of Outcomes: Not Met   RN Treatment Plan for Primary Diagnosis: <principal problem not specified> Long Term Goal(s): Knowledge of disease and therapeutic regimen to maintain health will improve  Short Term Goals: Ability to demonstrate self-control, Ability to verbalize feelings will improve and Ability to identify and develop effective coping behaviors will improve  Medication Management: RN will administer medications as ordered by provider, will assess and evaluate patient's response and provide education to patient for prescribed medication. RN will report any adverse and/or side effects to prescribing provider.  Therapeutic Interventions: 1 on 1 counseling sessions, Psychoeducation, Medication administration, Evaluate responses to treatment, Monitor vital signs and CBGs as ordered, Perform/monitor CIWA, COWS, AIMS and Fall Risk screenings as ordered, Perform wound care treatments as ordered.  Evaluation of Outcomes: Not Met   LCSW Treatment Plan for Primary Diagnosis: <principal problem not specified> Long Term Goal(s): Safe transition to appropriate next level of care at discharge, Engage patient in therapeutic group addressing interpersonal concerns.  Short Term Goals: Engage patient in aftercare planning with referrals and resources, Increase social support, Identify triggers  associated with mental health/substance abuse issues and Increase skills for wellness and recovery  Therapeutic Interventions: Assess for all discharge  needs, 1 to 1 time with Social worker, Explore available resources and support systems, Assess for adequacy in community support network, Educate family and significant other(s) on suicide prevention, Complete Psychosocial Assessment, Interpersonal group therapy.  Evaluation of Outcomes: Not Met   Progress in Treatment: Attending groups: No. Participating in groups: No. Taking medication as prescribed: Yes. Toleration medication: Yes. Family/Significant other contact made: No, will contact:  supports if consents are granted Patient understands diagnosis: Yes. Discussing patient identified problems/goals with staff: No. Medical problems stabilized or resolved: Yes. Denies suicidal/homicidal ideation: No. Issues/concerns per patient self-inventory: Yes.  New problem(s) identified: No, Describe:  CSW continuing to assess. Patient has been living in an [place], [place] unsure if patient is able to return there at this time.  New Short Term/Long Term Goal(s): detox, medication management for mood stabilization; elimination of SI thoughts; development of comprehensive mental wellness/sobriety plan.  Patient Goals:  Detox in a safe environment  Discharge Plan or Barriers: CSW continuing to assess for appropriate referrals, patient expressed interest in [facility]  Reason for Continuation of Hospitalization: Anxiety Depression Suicidal ideation Withdrawal symptoms  Estimated Length of Stay: 3-5 days  Attendees: Patient: Spencer Rogers 11/26/2018 1:53 PM  Physician:  11/26/2018 1:53 PM  Nursing:  11/26/2018 1:53 PM  RN Care Manager: 11/26/2018 1:53 PM  Social Worker: Stephanie Acre, [place] 11/26/2018 1:53 PM  Recreational Therapist:  11/26/2018 1:53 PM  Other:  11/26/2018 1:53 PM  Other:  11/26/2018 1:53 PM  Other: 11/26/2018 1:53 PM    Scribe for Treatment Team: Joellen Jersey, [place] 11/26/2018 1:53 PM

## 2018-11-26 NOTE — Progress Notes (Signed)
D: Pt was in bed in his room upon initial approach.  Pt presents with depressed affect and mood.  He reports his day was "good."  Pt denies having a goal so Probation officer and pt made goal for pt to be safe and sleep well.  Pt denies SI/HI, denies hallucinations, denies pain.  Pt has been isolative to room for majority of the night.  A: Introduced self to pt.  Met with pt 1:1.  Actively listened to pt and offered support and encouragement. PO fluids encouraged and provided.  Scheduled clonidine was not administered because pt has been hypotensive. Q15 minute safety checks maintained.  R: Pt is safe on the unit.  Pt verbally contracts for safety.  Will continue to monitor and assess.

## 2018-11-27 MED ORDER — RISPERIDONE 2 MG PO TABS
2.0000 mg | ORAL_TABLET | Freq: Every day | ORAL | Status: DC
  Administered 2018-11-27 – 2018-11-30 (×4): 2 mg via ORAL
  Filled 2018-11-27 (×6): qty 1

## 2018-11-27 MED ORDER — LAMOTRIGINE 100 MG PO TABS
100.0000 mg | ORAL_TABLET | Freq: Every day | ORAL | Status: DC
  Administered 2018-11-27 – 2018-12-01 (×5): 100 mg via ORAL
  Filled 2018-11-27: qty 7
  Filled 2018-11-27 (×6): qty 1

## 2018-11-27 MED ORDER — LAMOTRIGINE 100 MG PO TABS
200.0000 mg | ORAL_TABLET | Freq: Every day | ORAL | Status: DC
  Administered 2018-11-27 – 2018-11-30 (×4): 200 mg via ORAL
  Filled 2018-11-27 (×4): qty 2
  Filled 2018-11-27: qty 14
  Filled 2018-11-27: qty 2

## 2018-11-27 MED ORDER — LAMOTRIGINE 25 MG PO TABS
25.0000 mg | ORAL_TABLET | Freq: Every day | ORAL | Status: DC
  Filled 2018-11-27: qty 1

## 2018-11-27 MED ORDER — RISPERIDONE 1 MG PO TABS
1.0000 mg | ORAL_TABLET | Freq: Every day | ORAL | Status: DC
  Filled 2018-11-27: qty 1

## 2018-11-27 NOTE — Progress Notes (Addendum)
Patient ID: Spencer Rogers, male   DOB: 1996/01/04, 23 y.o.   MRN: [id number]  Nursing Progress Note [id number]  On initial approach, patient was seen up in the milieu. Patient presents with anxious affect and with complaints of withdrawal symptoms including body aches, tremors, and agitation. However, patient is pleasant and polite during interactions. Patient compliant with scheduled medications and provided PRNs as ordered. Patient requesting home medications to be reordered, MD notified. Patient does appear more steady on his feet this morning and is not lethargic. Patient states, "I don't even remember yesterday". Patient currently denies SI/HI/AVH. Patient seen sitting up in the dayroom and is interacting with peers.  Patient is educated about and provided medication per provider's orders. Patient safety maintained with q15 min safety checks and high fall risk precautions. Emotional support given, 1:1 interaction, and active listening provided. Patient encouraged to attend meals, groups, and work on treatment plan and goals. Labs, vital signs and patient behavior monitored throughout shift. Detox protocol and COW assessments in place. Vaccines administered as ordered with patient consent; education provided.  Patient contracts for safety with staff. Patient remains safe on the unit at this time and agrees to come to staff with any issues/concerns.  Will continue to support and monitor.   Patient's self-inventory sheet Rated Energy Level  Normal  Rated Sleep  Poor  Rated Appetite  Fair   Rated Anxiety (0-10)  8  Rated Hopelessness (0-10)  8  Rated Depression (0-10)  5  Daily Goal  "accepting that I'm here"  Any Additional Comments:

## 2018-11-27 NOTE — Progress Notes (Signed)
Patient stated he takes lamictal 300 mg daily and risperdal 4 mg daily for the past 2 years to prevent bipolar problems, SI and HI, if he does not have these 2 medicines.  Stated he was given lamcital 200 mg and risperdal 4 mg last night.  Stated he needs lamictal 100 mg this morning.

## 2018-11-27 NOTE — BHH Group Notes (Signed)
BHH LCSW Group Therapy Note  Date/Time  Type of Therapy/Topic:  Group Therapy:  Feelings about Diagnosis  Participation Level:  Active   Mood: Depressed  Description of Group:    This group will allow patients to explore their thoughts and feelings about diagnoses they have received. Patients will be guided to explore their level of understanding and acceptance of these diagnoses. Facilitator will encourage patients to process their thoughts and feelings about the reactions of others to their diagnosis, and will guide patients in identifying ways to discuss their diagnosis with significant others in their lives. This group will be process-oriented, with patients participating in exploration of their own experiences as well as giving and receiving support and challenge from other group members.   Therapeutic Goals: 1. Patient will demonstrate understanding of diagnosis as evidence by identifying two or more symptoms of the disorder:  2. Patient will be able to express two feelings. regarding the diagnosis 3. Patient will demonstrate ability to communicate their needs through discussion and/or role plays  Summary of Patient Progress: Spencer Rogers attended the entire session. He stated that he understands his diagnosis and that he has a difficult time managing it. [name] stated that he has learned a lot about himself through this disease which has been positive.   Therapeutic Modalities:   Cognitive Behavioral Therapy Brief Therapy Feelings Identification   Marian Sorrow, MSW Intern 11/27/2018 3:50 PM

## 2018-11-27 NOTE — Progress Notes (Signed)
Patient states that he was proud of the fact that he remained awake all day. His goal for tomorrow is to be in a better mood and participate more.

## 2018-11-27 NOTE — Plan of Care (Signed)
  Problem: Education: Goal: Knowledge of [facility] Education information/materials will improve Outcome: Progressing   Problem: Health Behavior/Discharge Planning: Goal: Identification of resources available to assist in meeting health care needs will improve Outcome: Progressing Goal: Compliance with treatment plan for underlying cause of condition will improve Outcome: Progressing   Problem: Physical Regulation: Goal: Ability to maintain clinical measurements within normal limits will improve Outcome: Progressing   Problem: Safety: Goal: Periods of time without injury will increase Outcome: Progressing
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
## 2018-11-27 NOTE — BHH Suicide Risk Assessment (Signed)
B[facility] INPATIENT:  Family/Significant Other Suicide Prevention Education  Suicide Prevention Education:  Education Completed; uncle, Spencer Rogers, [phone] has been identified by the patient as the family member/significant other with whom the patient will be residing, and identified as the person(s) who will aid the patient in the event of a mental health crisis (suicidal ideations/suicide attempt).  With written consent from the patient, the family member/significant other has been provided the following suicide prevention education, prior to the and/or following the discharge of the patient.  The suicide prevention education provided includes the following:  Suicide risk factors  Suicide prevention and interventions  National Suicide Hotline telephone number  [facility] assessment telephone number  [facility] Emergency Assistance 911  [facility] and/or Residential Mobile Crisis Unit telephone number  Request made of family/significant other to:  Remove weapons (e.g., guns, rifles, knives), all items previously/currently identified as safety concern.    Remove drugs/medications (over-the-counter, prescriptions, illicit drugs), all items previously/currently identified as a safety concern.  The family member/significant other verbalizes understanding of the suicide prevention education information provided.  The family member/significant other agrees to remove the items of safety concern listed above.  Uncle says patient called him earlier today asking if his uncle would put him up in a hotel for a week or so until he can get back into an [employer]. Uncle says the last time patient was in this situation, the patient used drugs in a hotel and his uncle does not want to enable the patient.   Uncle is understandably frustrated and reports he cannot continue to enable the patient. Additionally, he shares the patient "needs Jesus."  Spencer Rogers says the patient come stay  with him or other family as far as he is aware of.  Spencer Rogers 11/27/2018, 3:37 PM

## 2018-11-27 NOTE — Progress Notes (Addendum)
[facility] MD Progress Note  11/27/2018 12:38 PM Spencer Rogers  MRN:  [id number] Subjective:  "I am not feeling as effed up." Principal Problem: <principal problem not specified> Diagnosis: Active Problems:   MDD (major depressive disorder), recurrent episode, severe (HCC)  Total Time spent with patient: 30 minutes   Subjective: Patient is a 23 y.o. male who overdose on approximately 30 tablets of estazolam. Denies this was a suicide attempt. States that he took them throughout the day. Reports that he does not remember much about the event.Confirms information from H & P.  Objective: Patient is alert and oriented x 3, cooperative. Disheveled. Speech is clear and coherent. Mood is anxious and depressed, affect is congruent with mood. Denies current SI. Denies AVH. Reports that he was taking Lamictal 300 mg nightly, although prescribed as 100 mg every morning and 200 mg nightly. States that he was taking daily and last dose was Monday night. Reports that he was taking Risperdal 4 mg nightly. He is requesting to continue these medications. Reports that he slept 1.75 hours. Per notes he slept throughout the day and night. CIWA 4. COWS 6.   From H & P: History of Present Illness: Patient is seen and examined. Patient is a 23 year old male who was involuntarily committed from the [facility] emergency department after taking approximately 30 tablets of benzodiazepines. He stated those were estazolam. He stated he obtain these over the Internet. He was staying at an [facility], and his roommates found him stumbling over things. EMS was called. Patient had recently gone to [facility], and then [facility] following that. He reportedly had had 6 months of sobriety up until yesterday. He stated that his trigger was secondary to increased work stress. 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 His last  psychiatric hospitalization at our facility was in 2015. He was diagnosed at that time with severe opioid use disorder. There is also some mention of posttraumatic stress disorder. He was discharged on hydroxyzine and trazodone at that time. Currently he still significantly sedated, and is not a great historian. A note from 01/29/2018 from a visit at [facility] health showed him taking clonidine, gabapentin, Lamictal, Phenergan, Risperdal and MiraLAX. He was admitted to the hospital for evaluation and stabilization.  Past Psychiatric History: Reports history of Bipolar II, substance abuse-alcohol, benzodiazepines, opiates, heroin  Past Medical History:  Past Medical History:  Diagnosis Date  . Bipolar 1 disorder (HCC)   . History of drug overdose   . Polysubstance abuse (HCC)    opiates, benzos, cocaine, marijuana  . PTSD (post-traumatic stress disorder)   . Substance induced mood disorder [facility])     Past Surgical History:  Procedure Laterality Date  . unknown     Family History:  Family History  Problem Relation Age of Onset  . Sudden Cardiac Death Neg Hx    Family Psychiatric  History: Reports father had Bipolar disorder, father committed suicide Social History:  Social History   Substance and Sexual Activity  Alcohol Use Not Currently   Comment: occasionally      Social History   Substance and Sexual Activity  Drug Use Yes  . Types: Marijuana, Heroin, Benzodiazepines, IV, Cocaine   Comment: heroin, benzos    Social History   Socioeconomic History  . Marital status: Single    Spouse name: Not on file  . Number of children: Not on file  .  Years of education: Not on file  . Highest education level: Not on file  Occupational History  . Not on file  Social Needs  . Financial resource strain: Not on file  . Food insecurity:    Worry: Not on file    Inability: Not on file  . Transportation needs:    Medical: Not on file    Non-medical: Not on file  Tobacco Use  .  Smoking status: Current Every Day Smoker    Packs/day: 1.00    Years: 10.00    Pack years: 10.00    Types: Cigarettes  . Smokeless tobacco: Never Used  Substance and Sexual Activity  . Alcohol use: Not Currently    Comment: occasionally   . Drug use: Yes    Types: Marijuana, Heroin, Benzodiazepines, IV, Cocaine    Comment: heroin, benzos  . Sexual activity: Not Currently    Birth control/protection: None  Lifestyle  . Physical activity:    Days per week: Not on file    Minutes per session: Not on file  . Stress: Not on file  Relationships  . Social connections:    Talks on phone: Not on file    Gets together: Not on file    Attends religious service: Not on file    Active member of club or organization: Not on file    Attends meetings of clubs or organizations: Not on file    Relationship status: Not on file  Other Topics Concern  . Not on file  Social History Narrative  . Not on file   Additional Social History:    Pain Medications: denies Prescriptions: overdosed on benzodiazepine per pt Over the Counter: kratom per report History of alcohol / drug use?: Yes Longest period of sobriety (when/how long): unknown Negative Consequences of Use: Financial, Personal relationships Withdrawal Symptoms: Sweats, Other (Comment)(slurred speech, delayed response)                    Sleep: Fair  Appetite:  Good  Current Medications: Current Facility-Administered Medications  Medication Dose Route Frequency Provider Last Rate Last Dose  . chlordiazePOXIDE (LIBRIUM) capsule 25 mg  25 mg Oral QID PRN Antonieta Pert, MD   25 mg at 11/26/18 1821  . cloNIDine (CATAPRES) tablet 0.1 mg  0.1 mg Oral QID Antonieta Pert, MD   0.1 mg at 11/27/18 8588   Followed by  . [START ON 11/28/2018] cloNIDine (CATAPRES) tablet 0.1 mg  0.1 mg Oral BH-qamhs Clary, Marlane Mingle, MD       Followed by  . [START ON 11/30/2018] cloNIDine (CATAPRES) tablet 0.1 mg  0.1 mg Oral QAC breakfast  Antonieta Pert, MD      . feeding supplement (ENSURE ENLIVE) (ENSURE ENLIVE) liquid 237 mL  237 mL Oral BID BM Antonieta Pert, MD   237 mL at 11/27/18 0911  . hydrOXYzine (ATARAX/VISTARIL) tablet 25 mg  25 mg Oral Q6H PRN Antonieta Pert, MD   25 mg at 11/27/18 0132  . lamoTRIgine (LAMICTAL) tablet 100 mg  100 mg Oral Daily Nira Conn A, NP      . lamoTRIgine (LAMICTAL) tablet 200 mg  200 mg Oral QHS Antonieta Pert, MD      . magnesium hydroxide (MILK OF MAGNESIA) suspension 30 mL  30 mL Oral Daily PRN [name] E, PA-C      . naproxen (NAPROSYN) tablet 500 mg  500 mg Oral BID PRN Antonieta Pert, MD  500 mg at 11/27/18 0748  . nicotine polacrilex (NICORETTE) gum 2 mg  2 mg Oral PRN Antonieta Pert, MD      . ondansetron (ZOFRAN-ODT) disintegrating tablet 4 mg  4 mg Oral Q6H PRN Antonieta Pert, MD   4 mg at 11/26/18 1610  . risperiDONE (RISPERDAL) tablet 2 mg  2 mg Oral QHS Jackelyn Poling, NP        Lab Results:  Results for orders placed or performed during the hospital encounter of 11/26/18 (from the past 48 hour(s))  CK     Status: None   Collection Time: 11/26/18  6:28 PM  Result Value Ref Range   Total CK 124 49 - 397 U/L    Comment: Performed at [facility], [address], [place], [place] [address]    Blood Alcohol level:  Lab Results  Component Value Date   ETH <10 11/25/2018   ETH <10 05/27/2018    Metabolic Disorder Labs: No results found for: HGBA1C, MPG No results found for: PROLACTIN No results found for: CHOL, TRIG, HDL, CHOLHDL, VLDL, LDLCALC  Physical Findings: AIMS: Facial and Oral Movements Muscles of Facial Expression: None, normal Lips and Perioral Area: None, normal Jaw: None, normal Tongue: None, normal,Extremity Movements Upper (arms, wrists, hands, fingers): None, normal Lower (legs, knees, ankles, toes): None, normal, Trunk Movements Neck, shoulders, hips: None, normal, Overall Severity Severity  of abnormal movements (highest score from questions above): None, normal Incapacitation due to abnormal movements: None, normal Patient's awareness of abnormal movements (rate only patient's report): No Awareness, Dental Status Current problems with teeth and/or dentures?: No Does patient usually wear dentures?: No  CIWA:  CIWA-Ar Total: 4 COWS:  COWS Total Score: 6  Musculoskeletal: Strength & Muscle Tone: within normal limits Gait & Station: normal   Psychiatric Specialty Exam: Physical Exam  Constitutional: He is oriented to person, place, and time. He appears well-developed and well-nourished. No distress.  HENT:  Head: Normocephalic and atraumatic.  Respiratory: Effort normal. No respiratory distress.  Neurological: He is alert and oriented to person, place, and time.  Skin: He is not diaphoretic.  Psychiatric: His mood appears anxious. He is not withdrawn and not actively hallucinating. Thought content is not paranoid and not delusional. He expresses impulsivity and inappropriate judgment. He exhibits a depressed mood. He expresses no homicidal and no suicidal ideation.    Review of Systems  Constitutional: Positive for malaise/fatigue. Negative for chills, diaphoresis, fever and weight loss.  Respiratory: Negative for shortness of breath.   Cardiovascular: Negative for chest pain.  Gastrointestinal: Negative for diarrhea, nausea and vomiting.  Psychiatric/Behavioral: Positive for depression, substance abuse and suicidal ideas. Negative for hallucinations. The patient is nervous/anxious and has insomnia.     Blood pressure 106/63, pulse 63, temperature 98.2 F (36.8 C), temperature source Oral, resp. rate 16, height  (1.676 m), weight 58.3 kg, SpO2 97 %.Body mass index is 20.74 kg/m.  General Appearance: Disheveled  Eye Contact:  Fair  Speech:  Clear and Coherent and Normal Rate  Volume:  Normal  Mood:  Anxious and Depressed  Affect:  Congruent and Depressed  Thought  Process:  Coherent, Goal Directed and Descriptions of Associations: Intact  Orientation:  Full (Time, Place, and Person)  Thought Content:  Logical and Hallucinations: None  Suicidal Thoughts:  No  Homicidal Thoughts:  No  Memory:  Immediate;   Fair Recent;   Fair Remote;   Fair  Judgement:  Impaired  Insight:  Lacking  Psychomotor Activity:  Restlessness  Concentration:  Concentration: Fair and Attention Span: Fair  Recall:  Fair  Fund of Knowledge:  Good  Language:  Good  Akathisia:  No  Handed:  Right  AIMS (if indicated):     Assets:  Communication Skills Desire for Improvement Physical Health  ADL's:  Intact  Cognition:  WNL  Sleep:  Number of Hours: 1.75(slept during the day)     Treatment Plan Summary: Daily contact with patient to assess and evaluate symptoms and progress in treatment and Medication management   Resume Lamictal 100 mg every morning and 200 mg QHS for mood stability Resume Risperdal at 2 mg QHS for mood stability Continue Vistaril 25 mg every 6 hours prn anxiety Continue Librium 25 mg four times a day prn CIWA >10 Continue Clonidine opiate withdraw protocol Discharge planning in progress   Jackelyn Poling, NP 11/27/2018, 12:38 PM

## 2018-11-28 DIAGNOSIS — T424X1S Poisoning by benzodiazepines, accidental (unintentional), sequela: Secondary | ICD-10-CM

## 2018-11-28 DIAGNOSIS — F1120 Opioid dependence, uncomplicated: Secondary | ICD-10-CM | POA: Diagnosis present

## 2018-11-28 LAB — COMPREHENSIVE METABOLIC PANEL
ALT: 10 U/L (ref 0–44)
ANION GAP: 11 (ref 5–15)
AST: 16 U/L (ref 15–41)
Albumin: 4.6 g/dL (ref 3.5–5.0)
Alkaline Phosphatase: 54 U/L (ref 38–126)
BILIRUBIN TOTAL: 0.3 mg/dL (ref 0.3–1.2)
BUN: 21 mg/dL — ABNORMAL HIGH (ref 6–20)
CO2: 25 mmol/L (ref 22–32)
Calcium: 9.3 mg/dL (ref 8.9–10.3)
Chloride: 106 mmol/L (ref 98–111)
Creatinine, Ser: 1.16 mg/dL (ref 0.61–1.24)
GFR calc Af Amer: >60 mL/min (ref >60)
GFR calc non Af Amer: >60 mL/min (ref >60)
GLUCOSE: 98 mg/dL (ref 70–99)
Potassium: 4 mmol/L (ref 3.5–5.1)
Sodium: 142 mmol/L (ref 135–145)
Total Protein: 7 g/dL (ref 6.5–8.1)

## 2018-11-28 LAB — CBC WITH DIFFERENTIAL/PLATELET
Abs Immature Granulocytes: 0.04 10*3/uL (ref 0.00–0.07)
BASOS ABS: 0 10*3/uL (ref 0.0–0.1)
Basophils Relative: 1 %
Eosinophils Absolute: 0.2 10*3/uL (ref 0.0–0.5)
Eosinophils Relative: 3 %
HCT: 42.4 % (ref 39.0–52.0)
Hemoglobin: 13.8 g/dL (ref 13.0–17.0)
Immature Granulocytes: 1 %
Lymphocytes Relative: 24 %
Lymphs Abs: 1.7 10*3/uL (ref 0.7–4.0)
MCH: 30.7 pg (ref 26.0–34.0)
MCHC: 32.5 g/dL (ref 30.0–36.0)
MCV: 94.2 fL (ref 80.0–100.0)
Monocytes Absolute: 0.6 10*3/uL (ref 0.1–1.0)
Monocytes Relative: 8 %
NEUTROS PCT: 63 %
NRBC: 0 % (ref 0.0–0.2)
Neutro Abs: 4.6 10*3/uL (ref 1.7–7.7)
PLATELETS: 170 10*3/uL (ref 150–400)
RBC: 4.5 MIL/uL (ref 4.22–5.81)
RDW: 11.2 % — ABNORMAL LOW (ref 11.5–15.5)
WBC: 7.2 10*3/uL (ref 4.0–10.5)

## 2018-11-28 LAB — MYOGLOBIN, URINE: Myoglobin, Ur: <2 ng/mL (ref 0–13)

## 2018-11-28 NOTE — [facility] Group Notes (Signed)
LCSW Group Therapy Note  11/28/2018 4:01 PM  Type of Therapy and Topic: Group Therapy: Avoiding Self-Sabotaging and Enabling Behaviors  Participation Level: None  Description of Group:  In this group, patients will learn how to identify obstacles, self-sabotaging and enabling behaviors, as well as: what are they, why do we do them and what needs these behaviors meet. Discuss unhealthy relationships and how to have positive healthy boundaries with those that sabotage and enable. Explore aspects of self-sabotage and enabling in yourself and how to limit these self-destructive behaviors in everyday life.  Therapeutic Goals: 1. Patient will identify one obstacle that relates to self-sabotage and enabling behaviors 2. Patient will identify one personal self-sabotaging or enabling behavior they did prior to admission 3. Patient will state a plan to change the above identified behavior 4. Patient will demonstrate ability to communicate their needs through discussion and/or role play.   Summary of Patient Progress:  Patient remained present for group but declined to participate. He laughed and joked inappropriately at times but responded well to redirection.    Therapeutic Modalities:  Cognitive Behavioral Therapy Person-Centered Therapy Motivational Interviewing  Enid Cutter, MSW, [employer] Clinical Social Worker

## 2018-11-28 NOTE — Progress Notes (Signed)
Recreation Therapy Notes  Date:  2.28.20 Time: 0930 Location: [address] Dayroom  Group Topic: Stress Management  Goal Area(s) Addresses:  Patient will identify positive stress management techniques. Patient will identify benefits of using stress management post d/c.  Intervention: Stress Management  Activity :  Progressive Muscle Relaxation.  LRT introduced the stress management technique of stress management.  LRT read a script that focused on tensing and relaxing each muscle group individually.  Patients were to follow along as the script was read to engage in activity.  Education:  Stress Management, Discharge Planning.   Education Outcome: Acknowledges Education  Clinical Observations/Feedback:  Pt did not attend group.    Caroll Rancher, LRT/CTRS        Caroll Rancher A 11/28/2018 10:51 AM

## 2018-11-28 NOTE — Progress Notes (Signed)
Patient ID: Spencer Rogers, male   DOB: [birth year], 23 y.o.   MRN: [id number] D: Patient calm and cooperative on approach. Pt in dayroom playing cards and interacting with peers. Pt reports withdrawal symptoms including anxiety and body aches. Pt reports he is tolerating medication and increasing fluid intake to mentain blood pressure. Pt attended evening AA group and engaged appropriately with peers. Denies  SI/HI/AVH.No behavioral issues noted.  A: Support and encouragement offered as needed to express needs. Medications administered as prescribed.  R: Patient is safe and cooperative on unit. Will continue to monitor  for safety and stability.

## 2018-11-28 NOTE — Progress Notes (Signed)
[facility] MD Progress Note  11/28/2018 11:19 AM Spencer Rogers  MRN:  [id number] Subjective:  Patient is a 23 y.o. male who overdose on approximately 30 tablets of estazolam. Denies this was a suicide attempt. States that he took them throughout the day. Reports that he does not remember much about the event.Confirms information from H & P.  Objective: Patient is seen and examined.  Patient is a 23 year old male with a past psychiatric history significant for substance dependence and bipolar disorder versus posttraumatic stress disorder.  He is much more alert today.  He spent the last several days in bed.  He is walking around today and is alert and oriented x3.  He stated that he has decided that he wants to go to [facility] for substance rehabilitation.  Apparently when he spoke to social work yesterday he declined this, but he stated he spoke to his sponsor yesterday who spoke to someone else who recommended that he go to [facility].  He stated that he cannot return to the [facility] for 14 days after they asked him to leave with his relapse, and he stated he has nowhere else to go until then.  He will need to be in a facility.  He denied suicidal ideation this morning.  He stated that his overdose was intentional but not a suicide attempt.  His vital signs are relatively stable.  His blood pressure was 86/62, temperature was 97.4, pulse was 86.  He slept 6.5 hours by report.  He denied any auditory or visual hallucinations.  He received the Lamictal and Risperdal last night.  He stated he had received Risperdal 4 mg in the past, but I told him that we would be able to continue the 2 mg and less it became necessary to increase this given his cognitive status over the last several days.  Review of his laboratories revealed a mild left shift on 2/25.  Principal Problem: <principal problem not specified> Diagnosis: Active Problems:   MDD (major depressive disorder), recurrent episode, severe (HCC)  Total Time spent with  patient: 15 minutes  Past Psychiatric History: See admission H&P  Past Medical History:  Past Medical History:  Diagnosis Date  . Bipolar 1 disorder (HCC)   . History of drug overdose   . Polysubstance abuse (HCC)    opiates, benzos, cocaine, marijuana  . PTSD (post-traumatic stress disorder)   . Substance induced mood disorder [facility])     Past Surgical History:  Procedure Laterality Date  . unknown     Family History:  Family History  Problem Relation Age of Onset  . Sudden Cardiac Death Neg Hx    Family Psychiatric  History: See admission H&P Social History:  Social History   Substance and Sexual Activity  Alcohol Use Not Currently   Comment: occasionally      Social History   Substance and Sexual Activity  Drug Use Yes  . Types: Marijuana, Heroin, Benzodiazepines, IV, Cocaine   Comment: heroin, benzos    Social History   Socioeconomic History  . Marital status: Single    Spouse name: Not on file  . Number of children: Not on file  . Years of education: Not on file  . Highest education level: Not on file  Occupational History  . Not on file  Social Needs  . Financial resource strain: Not on file  . Food insecurity:    Worry: Not on file    Inability: Not on file  . Transportation needs:  Medical: Not on file    Non-medical: Not on file  Tobacco Use  . Smoking status: Current Every Day Smoker    Packs/day: 1.00    Years: 10.00    Pack years: 10.00    Types: Cigarettes  . Smokeless tobacco: Never Used  Substance and Sexual Activity  . Alcohol use: Not Currently    Comment: occasionally   . Drug use: Yes    Types: Marijuana, Heroin, Benzodiazepines, IV, Cocaine    Comment: heroin, benzos  . Sexual activity: Not Currently    Birth control/protection: None  Lifestyle  . Physical activity:    Days per week: Not on file    Minutes per session: Not on file  . Stress: Not on file  Relationships  . Social connections:    Talks on phone: Not on  file    Gets together: Not on file    Attends religious service: Not on file    Active member of club or organization: Not on file    Attends meetings of clubs or organizations: Not on file    Relationship status: Not on file  Other Topics Concern  . Not on file  Social History Narrative  . Not on file   Additional Social History:    Pain Medications: denies Prescriptions: overdosed on benzodiazepine per pt Over the Counter: kratom per report History of alcohol / drug use?: Yes Longest period of sobriety (when/how long): unknown Negative Consequences of Use: Financial, Personal relationships Withdrawal Symptoms: Sweats, Other (Comment)(slurred speech, delayed response)                    Sleep: Good  Appetite:  Fair  Current Medications: Current Facility-Administered Medications  Medication Dose Route Frequency Provider Last Rate Last Dose  . chlordiazePOXIDE (LIBRIUM) capsule 25 mg  25 mg Oral QID PRN Antonieta Pert, MD   25 mg at 11/26/18 1821  . cloNIDine (CATAPRES) tablet 0.1 mg  0.1 mg Oral BH-qamhs Morelia Cassells, Marlane Mingle, MD       Followed by  . [START ON 11/30/2018] cloNIDine (CATAPRES) tablet 0.1 mg  0.1 mg Oral QAC breakfast Antonieta Pert, MD      . feeding supplement (ENSURE ENLIVE) (ENSURE ENLIVE) liquid 237 mL  237 mL Oral BID BM Antonieta Pert, MD   237 mL at 11/27/18 1258  . hydrOXYzine (ATARAX/VISTARIL) tablet 25 mg  25 mg Oral Q6H PRN Antonieta Pert, MD   25 mg at 11/27/18 2147  . lamoTRIgine (LAMICTAL) tablet 100 mg  100 mg Oral Daily Nira Conn A, NP   100 mg at 11/28/18 0758  . lamoTRIgine (LAMICTAL) tablet 200 mg  200 mg Oral QHS Antonieta Pert, MD   200 mg at 11/27/18 2147  . magnesium hydroxide (MILK OF MAGNESIA) suspension 30 mL  30 mL Oral Daily PRN [name] E, PA-C      . naproxen (NAPROSYN) tablet 500 mg  500 mg Oral BID PRN Antonieta Pert, MD   500 mg at 11/27/18 0748  . nicotine polacrilex (NICORETTE) gum 2 mg  2 mg  Oral PRN Antonieta Pert, MD   2 mg at 11/28/18 1006  . ondansetron (ZOFRAN-ODT) disintegrating tablet 4 mg  4 mg Oral Q6H PRN Antonieta Pert, MD   4 mg at 11/26/18 5789  . risperiDONE (RISPERDAL) tablet 2 mg  2 mg Oral QHS Nira Conn A, NP   2 mg at 11/27/18 2147    Lab Results:  Results for orders placed or performed during the hospital encounter of 11/26/18 (from the past 48 hour(s))  CK     Status: None   Collection Time: 11/26/18  6:28 PM  Result Value Ref Range   Total CK 124 49 - 397 U/L    Comment: Performed at [facility], [address] [place], [place] [address]    Blood Alcohol level:  Lab Results  Component Value Date   ETH <10 11/25/2018   ETH <10 05/27/2018    Metabolic Disorder Labs: No results found for: HGBA1C, MPG No results found for: PROLACTIN No results found for: CHOL, TRIG, HDL, CHOLHDL, VLDL, LDLCALC  Physical Findings: AIMS: Facial and Oral Movements Muscles of Facial Expression: None, normal Lips and Perioral Area: None, normal Jaw: None, normal Tongue: None, normal,Extremity Movements Upper (arms, wrists, hands, fingers): None, normal Lower (legs, knees, ankles, toes): None, normal, Trunk Movements Neck, shoulders, hips: None, normal, Overall Severity Severity of abnormal movements (highest score from questions above): None, normal Incapacitation due to abnormal movements: None, normal Patient's awareness of abnormal movements (rate only patient's report): No Awareness, Dental Status Current problems with teeth and/or dentures?: No Does patient usually wear dentures?: No  CIWA:  CIWA-Ar Total: 4 COWS:  COWS Total Score: 0  Musculoskeletal: Strength & Muscle Tone: within normal limits Gait & Station: normal Patient leans: N/A  Psychiatric Specialty Exam: Physical Exam  Nursing note and vitals reviewed. Constitutional: He is oriented to person, place, and time. He appears well-developed and well-nourished.   HENT:  Head: Normocephalic and atraumatic.  Respiratory: Effort normal.  Neurological: He is alert and oriented to person, place, and time.    ROS  Blood pressure (!) 86/62, pulse 86, temperature (!) 97.4 F (36.3 C), temperature source Oral, resp. rate 16, height  (1.676 m), weight 58.3 kg, SpO2 97 %.Body mass index is 20.74 kg/m.  General Appearance: Disheveled  Eye Contact:  Fair  Speech:  Normal Rate  Volume:  Normal  Mood:  Anxious  Affect:  Congruent  Thought Process:  Coherent and Descriptions of Associations: Intact  Orientation:  Full (Time, Place, and Person)  Thought Content:  Logical  Suicidal Thoughts:  No  Homicidal Thoughts:  No  Memory:  Immediate;   Fair Recent;   Fair Remote;   Fair  Judgement:  Impaired  Insight:  Lacking  Psychomotor Activity:  Normal  Concentration:  Concentration: Fair and Attention Span: Fair  Recall:  [employer] of Knowledge:  Fair  Language:  Fair  Akathisia:  Negative  Handed:  Right  AIMS (if indicated):     Assets:  Desire for Improvement Physical Health Resilience  ADL's:  Intact  Cognition:  WNL  Sleep:  Number of Hours: 6.5     Treatment Plan Summary: Daily contact with patient to assess and evaluate symptoms and progress in treatment, Medication management and Plan : Patient is seen and examined.  Patient is a 23 year old male with the above-stated past psychiatric history who is seen in follow-up.  He is doing much better today.  He is alert and oriented x3.  His cognitive status is improved.  He is not oversedated.  No change in his current medications.  He will discussed with social work the possibility of getting to a substance rehabilitation program.  He went to the [facility] shortly after getting out of prison last year, and would like to go back to [facility] where he had been prior to his imprisonment.  1.  Continue Librium 25 mg p.o. 4 times daily as needed withdrawal symptoms with a CIWA greater than 10. 2.   Continue hydroxyzine 25 mg every 6 hours as needed anxiety. 3.  Continue Lamictal 100 mg p.o. daily and 200 mg p.o. nightly for mood stability. 4.  Continue Naprosyn 500 mg p.o. twice daily as needed pain. 5.  Continue Risperdal 2 mg p.o. nightly for sleep and mood stability. 6.  Discharge planning-patient has requested an application to [facility]  Antonieta Pert, MD 11/28/2018, 11:19 AM

## 2018-11-28 NOTE — Plan of Care (Addendum)
Patient was resting in bed with eyes closed upon initial approach. Patient was very minimal with his interaction and forwarded little information. Denies SI HI AVH. Denies any physical pain.  Patient is compliant with medications prescribed per provider. No side effects noted. Safety is maintained with 15 minute checks as well as environmental checks. Will continue to monitor and provide support.  Problem: Education: Goal: Emotional status will improve Outcome: Not Progressing Goal: Mental status will improve Outcome: Not Progressing Goal: Verbalization of understanding the information provided will improve Outcome: Not Progressing   Problem: Activity: Goal: Interest or engagement in activities will improve Outcome: Not Progressing Goal: Sleeping patterns will improve Outcome: Not Progressing   Problem: Coping: Goal: Ability to verbalize frustrations and anger appropriately will improve Outcome: Not Progressing

## 2018-11-28 NOTE — Progress Notes (Signed)
D: Pt denies SI/HI/AVH. Pt is pleasant and cooperative. Pt visible in dayroom playing cards with peers this evening A: Pt was offered support and encouragement. Pt was given scheduled medications. Pt was encourage to attend groups. Q 15 minute checks were done for safety.  R:Pt attends groups and interacts well with peers and staff. Pt is taking medication. Pt has no complaints.Pt receptive to treatment and safety maintained on unit.  Problem: Education: Goal: Emotional status will improve Outcome: Progressing   Problem: Activity: Goal: Interest or engagement in activities will improve Outcome: Progressing

## 2018-11-28 NOTE — Progress Notes (Signed)
CSW faxed referral to [facility].   Enid Cutter, LCSW-A Clinical Social Worker

## 2018-11-29 DIAGNOSIS — T424X1S Poisoning by benzodiazepines, accidental (unintentional), sequela: Secondary | ICD-10-CM

## 2018-11-29 NOTE — Progress Notes (Signed)
D.  Pt pleasant on approach, no complaints voiced.  Pt was positive for evening AA group, observed engaged in appropriate interaction with peers on the unit.  Pt denies SI/HI/AVH at this time.  A.  Support and encouragement offered, medication given as ordered  R.  Pt remains safe on the unit, will continue to monitor.   

## 2018-11-29 NOTE — Progress Notes (Signed)
Adult Psychoeducational Group Note  Date:  11/29/2018 Time:  9:26 PM  Group Topic/Focus:  Wrap-Up Group:   The focus of this group is to help patients review their daily goal of treatment and discuss progress on daily workbooks.  Participation Level:  Active  Participation Quality:  Appropriate  Affect:  Appropriate  Cognitive:  Appropriate  Insight: Appropriate  Engagement in Group:  Engaged  Modes of Intervention:  Discussion  Additional Comments:  Pt stated his goal was to have a better day.  Pt stated he did meet his goal.  Pt rated the day at a 7/10 because he was able to joke around and laugh.  Spencer Rogers 11/29/2018, 9:26 PM

## 2018-11-29 NOTE — Progress Notes (Signed)
[facility] MD Progress Note  11/29/2018 10:59 AM KOSTAS VENEZIANO  MRN:  [id number]   Subjective: Spencer Rogers reports " they will not give me my correct dose of medications I was taking Risperdal 4 mg"  Evaluation: Patient is awake alert oriented x3.  Seen resting in day room interacting with peers.  Patient presents irritable, guarded and depressed.  States he is detoxing from [employer]. ("opiate herb").  Denies suicidal or homicidal ideations.  Denies auditory or visual hallucinations.  States he was previous in [facility] and is hopeful to return after discharge.  Patient states he was told he was allergic to medications however is unable to recall the name of medications.  States his eyes rolled in the back of his head.  Education provided with higher dose of Risperdal patient appeared to be receptive to teaching.  We will continue medications as directed.  Support encouragement reassurance was provided.  History: per assessment notePatient is seen and examined. Patient is a 23 year old male who was involuntarily committed from the [facility] emergency department after taking approximately 30 tablets of benzodiazepines. He stated those were estazolam. He stated he obtain these over the Internet. He was staying at an [employer], and his roommates found him stumbling over things. EMS was called. Patient had recently gone to [facility], and then [facility] following that. He reportedly had had 6 months of sobriety up until yesterday. He stated that his trigger was secondary to increased work stress. 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  Principal Problem: Benzodiazepine toxicity, accidental or unintentional, sequela Diagnosis: Principal Problem:   Benzodiazepine toxicity, accidental or unintentional, sequela Active Problems:   Benzodiazepine abuse (HCC)   Posttraumatic stress disorder   Opioid dependence (HCC)  Total Time spent with patient: 15 minutes  Past Psychiatric  History: See admission H&P  Past Medical History:  Past Medical History:  Diagnosis Date  . Bipolar 1 disorder (HCC)   . History of drug overdose   . Polysubstance abuse (HCC)    opiates, benzos, cocaine, marijuana  . PTSD (post-traumatic stress disorder)   . Substance induced mood disorder [facility])     Past Surgical History:  Procedure Laterality Date  . unknown     Family History:  Family History  Problem Relation Age of Onset  . Sudden Cardiac Death Neg Hx    Family Psychiatric  History: See admission H&P Social History:  Social History   Substance and Sexual Activity  Alcohol Use Not Currently   Comment: occasionally      Social History   Substance and Sexual Activity  Drug Use Yes  . Types: Marijuana, Heroin, Benzodiazepines, IV, Cocaine   Comment: heroin, benzos    Social History   Socioeconomic History  . Marital status: Single    Spouse name: Not on file  . Number of children: Not on file  . Years of education: Not on file  . Highest education level: Not on file  Occupational History  . Not on file  Social Needs  . Financial resource strain: Not on file  . Food insecurity:    Worry: Not on file    Inability: Not on file  . Transportation needs:    Medical: Not on file    Non-medical: Not on file  Tobacco Use  . Smoking status: Current Every Day Smoker    Packs/day: 1.00    Years: 10.00    Pack years: 10.00    Types: Cigarettes  . Smokeless tobacco:  Never Used  Substance and Sexual Activity  . Alcohol use: Not Currently    Comment: occasionally   . Drug use: Yes    Types: Marijuana, Heroin, Benzodiazepines, IV, Cocaine    Comment: heroin, benzos  . Sexual activity: Not Currently    Birth control/protection: None  Lifestyle  . Physical activity:    Days per week: Not on file    Minutes per session: Not on file  . Stress: Not on file  Relationships  . Social connections:    Talks on phone: Not on file    Gets together: Not on file     Attends religious service: Not on file    Active member of club or organization: Not on file    Attends meetings of clubs or organizations: Not on file    Relationship status: Not on file  Other Topics Concern  . Not on file  Social History Narrative  . Not on file   Additional Social History:    Pain Medications: denies Prescriptions: overdosed on benzodiazepine per pt Over the Counter: kratom per report History of alcohol / drug use?: Yes Longest period of sobriety (when/how long): unknown Negative Consequences of Use: Financial, Personal relationships Withdrawal Symptoms: Sweats, Other (Comment)(slurred speech, delayed response)                    Sleep: Good  Appetite:  Fair  Current Medications: Current Facility-Administered Medications  Medication Dose Route Frequency Provider Last Rate Last Dose  . chlordiazePOXIDE (LIBRIUM) capsule 25 mg  25 mg Oral QID PRN Antonieta Pert, MD   25 mg at 11/26/18 1821  . cloNIDine (CATAPRES) tablet 0.1 mg  0.1 mg Oral BH-qamhs Antonieta Pert, MD   0.1 mg at 11/29/18 0825   Followed by  . [START ON 11/30/2018] cloNIDine (CATAPRES) tablet 0.1 mg  0.1 mg Oral QAC breakfast Antonieta Pert, MD      . feeding supplement (ENSURE ENLIVE) (ENSURE ENLIVE) liquid 237 mL  237 mL Oral BID BM Antonieta Pert, MD   237 mL at 11/28/18 1500  . hydrOXYzine (ATARAX/VISTARIL) tablet 25 mg  25 mg Oral Q6H PRN Antonieta Pert, MD   25 mg at 11/29/18 0200  . lamoTRIgine (LAMICTAL) tablet 100 mg  100 mg Oral Daily Nira Conn A, NP   100 mg at 11/29/18 0826  . lamoTRIgine (LAMICTAL) tablet 200 mg  200 mg Oral QHS Antonieta Pert, MD   200 mg at 11/28/18 2136  . magnesium hydroxide (MILK OF MAGNESIA) suspension 30 mL  30 mL Oral Daily PRN [name] E, PA-C      . naproxen (NAPROSYN) tablet 500 mg  500 mg Oral BID PRN Antonieta Pert, MD   500 mg at 11/28/18 2136  . nicotine polacrilex (NICORETTE) gum 2 mg  2 mg Oral PRN Antonieta Pert, MD   2 mg at 11/29/18 1610  . ondansetron (ZOFRAN-ODT) disintegrating tablet 4 mg  4 mg Oral Q6H PRN Antonieta Pert, MD   4 mg at 11/26/18 9604  . risperiDONE (RISPERDAL) tablet 2 mg  2 mg Oral QHS Nira Conn A, NP   2 mg at 11/28/18 2136    Lab Results:  Results for orders placed or performed during the hospital encounter of 11/26/18 (from the past 48 hour(s))  Comprehensive metabolic panel     Status: Abnormal   Collection Time: 11/28/18  6:43 PM  Result Value Ref Range   Sodium  142 135 - 145 mmol/L   Potassium 4.0 3.5 - 5.1 mmol/L   Chloride 106 98 - 111 mmol/L   CO2 25 22 - 32 mmol/L   Glucose, Bld 98 70 - 99 mg/dL   BUN 21 (H) 6 - 20 mg/dL   Creatinine, Ser 4.54 0.61 - 1.24 mg/dL   Calcium 9.3 8.9 - 09.8 mg/dL   Total Protein 7.0 6.5 - 8.1 g/dL   Albumin 4.6 3.5 - 5.0 g/dL   AST 16 15 - 41 U/L   ALT 10 0 - 44 U/L   Alkaline Phosphatase 54 38 - 126 U/L   Total Bilirubin 0.3 0.3 - 1.2 mg/dL   GFR calc non Af Amer >60 >60 mL/min   GFR calc Af Amer >60 >60 mL/min   Anion gap 11 5 - 15    Comment: Performed at [facility], [address] [place], [place] [address]  CBC with Differential/Platelet     Status: Abnormal   Collection Time: 11/28/18  6:43 PM  Result Value Ref Range   WBC 7.2 4.0 - 10.5 K/uL   RBC 4.50 4.22 - 5.81 MIL/uL   Hemoglobin 13.8 13.0 - 17.0 g/dL   HCT 78.2 95.6 - 21.3 %   MCV 94.2 80.0 - 100.0 fL   MCH 30.7 26.0 - 34.0 pg   MCHC 32.5 30.0 - 36.0 g/dL   RDW 08.6 (L) 57.8 - 46.9 %   Platelets 170 150 - 400 K/uL   nRBC 0.0 0.0 - 0.2 %   Neutrophils Relative % 63 %   Neutro Abs 4.6 1.7 - 7.7 K/uL   Lymphocytes Relative 24 %   Lymphs Abs 1.7 0.7 - 4.0 K/uL   Monocytes Relative 8 %   Monocytes Absolute 0.6 0.1 - 1.0 K/uL   Eosinophils Relative 3 %   Eosinophils Absolute 0.2 0.0 - 0.5 K/uL   Basophils Relative 1 %   Basophils Absolute 0.0 0.0 - 0.1 K/uL   Immature Granulocytes 1 %   Abs Immature Granulocytes 0.04  0.00 - 0.07 K/uL    Comment: Performed at [facility], [address] [place], [place] [address]    Blood Alcohol level:  Lab Results  Component Value Date   ETH <10 11/25/2018   ETH <10 05/27/2018    Metabolic Disorder Labs: No results found for: HGBA1C, MPG No results found for: PROLACTIN No results found for: CHOL, TRIG, HDL, CHOLHDL, VLDL, LDLCALC  Physical Findings: AIMS: Facial and Oral Movements Muscles of Facial Expression: None, normal Lips and Perioral Area: None, normal Jaw: None, normal Tongue: None, normal,Extremity Movements Upper (arms, wrists, hands, fingers): None, normal Lower (legs, knees, ankles, toes): None, normal, Trunk Movements Neck, shoulders, hips: None, normal, Overall Severity Severity of abnormal movements (highest score from questions above): None, normal Incapacitation due to abnormal movements: None, normal Patient's awareness of abnormal movements (rate only patient's report): No Awareness, Dental Status Current problems with teeth and/or dentures?: No Does patient usually wear dentures?: No  CIWA:  CIWA-Ar Total: 4 COWS:  COWS Total Score: 1  Musculoskeletal: Strength & Muscle Tone: within normal limits Gait & Station: normal Patient leans: N/A  Psychiatric Specialty Exam: Physical Exam  Nursing note and vitals reviewed. Constitutional: He is oriented to person, place, and time. He appears well-developed and well-nourished.  HENT:  Head: Normocephalic and atraumatic.  Respiratory: Effort normal.  Neurological: He is alert and oriented to person, place, and time.  Psychiatric: He has a normal  mood and affect. His behavior is normal.    Review of Systems  Psychiatric/Behavioral: Positive for depression. Negative for hallucinations. The patient is nervous/anxious.   All other systems reviewed and are negative.   Blood pressure 105/65, pulse 92, temperature (!) 97.5 F (36.4 C), temperature source Oral, resp.  rate 20, height 5\' 6"  (1.676 m), weight 58.3 kg, SpO2 97 %.Body mass index is 20.74 kg/m.  General Appearance: Disheveled, malodorous  Eye Contact:  Fair  Speech:  Normal Rate  Volume:  Normal  Mood:  Anxious  Affect:  Congruent  Thought Process:  Linear  Orientation:  Full (Time, Place, and Person)  Thought Content:  Logical  Suicidal Thoughts:  No  Homicidal Thoughts:  No  Memory:  Immediate;   Fair Recent;   Fair Remote;   Fair  Judgement:  Impaired  Insight:  Lacking  Psychomotor Activity:  Normal  Concentration:  Concentration: Fair and Attention Span: Fair  Recall:  [employer] of Knowledge:  Fair  Language:  Fair  Akathisia:  Negative  Handed:  Right  AIMS (if indicated):     Assets:  Desire for Improvement Physical Health Resilience  ADL's:  Intact  Cognition:  WNL  Sleep:  Number of Hours: 4     Treatment Plan Summary: Daily contact with patient to assess and evaluate symptoms and progress in treatment and Medication management   Continue with current treatment plan on 11/29/2018 as listed below except were noted  1.  Continue Librium 25 mg p.o. 4 times daily as needed withdrawal symptoms with a CIWA greater than 10. 2.  Continue hydroxyzine 25 mg every 6 hours as needed anxiety. 3.  Continue Lamictal 100 mg p.o. daily and 200 mg p.o. nightly for mood stability. 4.  Continue Naprosyn 500 mg p.o. twice daily as needed pain. 5.  Continue Risperdal 2 mg p.o. nightly for sleep and mood stability.  -  Discharge planning-patient has requested an application to [facility] -Patient encouraged to participate within the therapeutic milieu  Oneta Rack, NP 11/29/2018, 10:59 AM

## 2018-11-29 NOTE — [facility] Group Notes (Signed)
LCSW Group Therapy Note  11/29/2018   10:00-11:00am   Type of Therapy and Topic:  Group Therapy: Anger Cues and Responses  Participation Level:  Active   Description of Group:   In this group, patients learned how to recognize the physical, cognitive, emotional, and behavioral responses they have to anger-provoking situations.  They identified a recent time they became angry and how they reacted.  They analyzed how their reaction was possibly beneficial and how it was possibly unhelpful.  The group discussed a variety of healthier coping skills that could help with such a situation in the future.  Deep breathing was practiced briefly.  Therapeutic Goals: 1. Patients will remember their last incident of anger and how they felt emotionally and physically, what their thoughts were at the time, and how they behaved. 2. Patients will identify how their behavior at that time worked for them, as well as how it worked against them. 3. Patients will explore possible new behaviors to use in future anger situations. 4. Patients will learn that anger itself is normal and cannot be eliminated, and that healthier reactions can assist with resolving conflict rather than worsening situations.  Summary of Patient Progress:  The patient shared that his most recent time of anger was this morning and said he was very angry because he drinks black coffee all day long at home and therefore needs his coffee, but could not get any more this morning.  He stated he "blew up."  He seemed proud of this and participated very little during group except to share about his blow ups.  Therapeutic Modalities:   Cognitive Behavioral Therapy  Lynnell Chad

## 2018-11-29 NOTE — Progress Notes (Signed)
Adult Psychoeducational Group Note  Date:  11/29/2018 Time:  5:25 PM  Group Topic/Focus:  Goals Group:   The focus of this group is to help patients establish daily goals to achieve during treatment and discuss how the patient can incorporate goal setting into their daily lives to aide in recovery.  Participation Level:  Active  Participation Quality:  Appropriate  Affect:  Appropriate  Cognitive:  Alert  Insight: Appropriate  Engagement in Group:  Engaged  Modes of Intervention:  Discussion  Additional Comments:  Pt attended group and participated in discussion.  Kristalyn Bergstresser R Keino Placencia 11/29/2018, 5:25 PM

## 2018-11-30 MED ORDER — CLONIDINE HCL 0.1 MG PO TABS
0.1000 mg | ORAL_TABLET | Freq: Every day | ORAL | Status: DC
  Administered 2018-11-30: 0.1 mg via ORAL
  Filled 2018-11-30 (×2): qty 1

## 2018-11-30 MED ORDER — BACITRACIN-NEOMYCIN-POLYMYXIN OINTMENT TUBE
TOPICAL_OINTMENT | Freq: Three times a day (TID) | CUTANEOUS | Status: DC
  Administered 2018-11-30: 1 via TOPICAL
  Filled 2018-11-30 (×2): qty 14.17

## 2018-11-30 NOTE — Progress Notes (Signed)
D: Pt denies SI/HI/AVH. Pt is pleasant and cooperative. Pt stated he could not sleep without clonidine, NP-Jason gave pt 1x 0.1 Clonidine HS and pt is to not take AC dose tomorrow. Pt visible watching TV with peers, pt stated he was doing better  A: Pt was offered support and encouragement. Pt was given scheduled medications. Pt was encourage to attend groups. Q 15 minute checks were done for safety.   R:Pt attends groups and interacts well with peers and staff. Pt is taking medication. Pt has no complaints.Pt receptive to treatment and safety maintained on unit.  Problem: Education: Goal: Mental status will improve Outcome: Progressing   Problem: Activity: Goal: Interest or engagement in activities will improve Outcome: Progressing   Problem: Coping: Goal: Ability to verbalize frustrations and anger appropriately will improve Outcome: Progressing

## 2018-11-30 NOTE — Progress Notes (Signed)
[facility] MD Progress Note  11/30/2018 9:51 AM Spencer Rogers  MRN:  [id number]     Evaluation: Spencer Rogers observed sitting in day room interacting with peers.  Presents with a flat affect.  Reports" I am manic, I feel I need more Risperdal."  patient continues to request for medication adjustments. Saff reported medication seeking behaviors. Patient advised to follow-up with outpatient provider.  Patient reports he has plans to be admitted back into the [facility].  States he was kicked out of the [facility] and now has to wait 14 days to be readmitted.  Denies suicidal homicidal ideations.  Denies auditory or visual hallucinations.  Rates his depression 3 out of 10 with 10 being the worst.  Ports a good appetite.  States he is resting well throughout the night.  Support encouragement and reassurance was provided.  History: per assessment notePatient is seen and examined. Patient is a 23 year old male who was involuntarily committed from the [facility] emergency department after taking approximately 30 tablets of benzodiazepines. He stated those were estazolam. He stated he obtain these over the Internet. He was staying at an [facility], and his roommates found him stumbling over things. EMS was called. Patient had recently gone to [facility], and then [facility] following that. He reportedly had had 6 months of sobriety up until yesterday. He stated that his trigger was secondary to increased work stress. 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  Principal Problem: Benzodiazepine toxicity, accidental or unintentional, sequela Diagnosis: Principal Problem:   Benzodiazepine toxicity, accidental or unintentional, sequela Active Problems:   Benzodiazepine abuse (HCC)   Posttraumatic stress disorder   Opioid dependence (HCC)  Total Time spent with patient: 15 minutes  Past Psychiatric History: See admission H&P  Past Medical History:  Past Medical History:  Diagnosis  Date  . Bipolar 1 disorder (HCC)   . History of drug overdose   . Polysubstance abuse (HCC)    opiates, benzos, cocaine, marijuana  . PTSD (post-traumatic stress disorder)   . Substance induced mood disorder [facility])     Past Surgical History:  Procedure Laterality Date  . unknown     Family History:  Family History  Problem Relation Age of Onset  . Sudden Cardiac Death Neg Hx    Family Psychiatric  History: See admission H&P Social History:  Social History   Substance and Sexual Activity  Alcohol Use Not Currently   Comment: occasionally      Social History   Substance and Sexual Activity  Drug Use Yes  . Types: Marijuana, Heroin, Benzodiazepines, IV, Cocaine   Comment: heroin, benzos    Social History   Socioeconomic History  . Marital status: Single    Spouse name: Not on file  . Number of children: Not on file  . Years of education: Not on file  . Highest education level: Not on file  Occupational History  . Not on file  Social Needs  . Financial resource strain: Not on file  . Food insecurity:    Worry: Not on file    Inability: Not on file  . Transportation needs:    Medical: Not on file    Non-medical: Not on file  Tobacco Use  . Smoking status: Current Every Day Smoker    Packs/day: 1.00    Years: 10.00    Pack years: 10.00    Types: Cigarettes  . Smokeless tobacco: Never Used  Substance and Sexual Activity  . Alcohol use:  Not Currently    Comment: occasionally   . Drug use: Yes    Types: Marijuana, Heroin, Benzodiazepines, IV, Cocaine    Comment: heroin, benzos  . Sexual activity: Not Currently    Birth control/protection: None  Lifestyle  . Physical activity:    Days per week: Not on file    Minutes per session: Not on file  . Stress: Not on file  Relationships  . Social connections:    Talks on phone: Not on file    Gets together: Not on file    Attends religious service: Not on file    Active member of club or organization: Not on  file    Attends meetings of clubs or organizations: Not on file    Relationship status: Not on file  Other Topics Concern  . Not on file  Social History Narrative  . Not on file   Additional Social History:    Pain Medications: denies Prescriptions: overdosed on benzodiazepine per pt Over the Counter: kratom per report History of alcohol / drug use?: Yes Longest period of sobriety (when/how long): unknown Negative Consequences of Use: Financial, Personal relationships Withdrawal Symptoms: Sweats, Other (Comment)(slurred speech, delayed response)                    Sleep: Good  Appetite:  Fair  Current Medications: Current Facility-Administered Medications  Medication Dose Route Frequency Provider Last Rate Last Dose  . chlordiazePOXIDE (LIBRIUM) capsule 25 mg  25 mg Oral QID PRN Antonieta Pert, MD   25 mg at 11/26/18 1821  . cloNIDine (CATAPRES) tablet 0.1 mg  0.1 mg Oral QAC breakfast Antonieta Pert, MD      . feeding supplement (ENSURE ENLIVE) (ENSURE ENLIVE) liquid 237 mL  237 mL Oral BID BM Antonieta Pert, MD   237 mL at 11/29/18 1433  . hydrOXYzine (ATARAX/VISTARIL) tablet 25 mg  25 mg Oral Q6H PRN Antonieta Pert, MD   25 mg at 11/29/18 2125  . lamoTRIgine (LAMICTAL) tablet 100 mg  100 mg Oral Daily Nira Conn A, NP   100 mg at 11/30/18 0741  . lamoTRIgine (LAMICTAL) tablet 200 mg  200 mg Oral QHS Antonieta Pert, MD   200 mg at 11/29/18 2126  . magnesium hydroxide (MILK OF MAGNESIA) suspension 30 mL  30 mL Oral Daily PRN [name] E, PA-C      . naproxen (NAPROSYN) tablet 500 mg  500 mg Oral BID PRN Antonieta Pert, MD   500 mg at 11/29/18 2125  . nicotine polacrilex (NICORETTE) gum 2 mg  2 mg Oral PRN Antonieta Pert, MD   2 mg at 11/30/18 1683  . ondansetron (ZOFRAN-ODT) disintegrating tablet 4 mg  4 mg Oral Q6H PRN Antonieta Pert, MD   4 mg at 11/26/18 7290  . risperiDONE (RISPERDAL) tablet 2 mg  2 mg Oral QHS Nira Conn A, NP    2 mg at 11/29/18 2126    Lab Results:  Results for orders placed or performed during the hospital encounter of 11/26/18 (from the past 48 hour(s))  Comprehensive metabolic panel     Status: Abnormal   Collection Time: 11/28/18  6:43 PM  Result Value Ref Range   Sodium 142 135 - 145 mmol/L   Potassium 4.0 3.5 - 5.1 mmol/L   Chloride 106 98 - 111 mmol/L   CO2 25 22 - 32 mmol/L   Glucose, Bld 98 70 - 99 mg/dL   BUN  21 (H) 6 - 20 mg/dL   Creatinine, Ser 1.61 0.61 - 1.24 mg/dL   Calcium 9.3 8.9 - 09.6 mg/dL   Total Protein 7.0 6.5 - 8.1 g/dL   Albumin 4.6 3.5 - 5.0 g/dL   AST 16 15 - 41 U/L   ALT 10 0 - 44 U/L   Alkaline Phosphatase 54 38 - 126 U/L   Total Bilirubin 0.3 0.3 - 1.2 mg/dL   GFR calc non Af Amer >60 >60 mL/min   GFR calc Af Amer >60 >60 mL/min   Anion gap 11 5 - 15    Comment: Performed at [facility], [address] [place], [place] [address]  CBC with Differential/Platelet     Status: Abnormal   Collection Time: 11/28/18  6:43 PM  Result Value Ref Range   WBC 7.2 4.0 - 10.5 K/uL   RBC 4.50 4.22 - 5.81 MIL/uL   Hemoglobin 13.8 13.0 - 17.0 g/dL   HCT 98.1 19.1 - 47.8 %   MCV 94.2 80.0 - 100.0 fL   MCH 30.7 26.0 - 34.0 pg   MCHC 32.5 30.0 - 36.0 g/dL   RDW 29.5 (L) 62.1 - 30.8 %   Platelets 170 150 - 400 K/uL   nRBC 0.0 0.0 - 0.2 %   Neutrophils Relative % 63 %   Neutro Abs 4.6 1.7 - 7.7 K/uL   Lymphocytes Relative 24 %   Lymphs Abs 1.7 0.7 - 4.0 K/uL   Monocytes Relative 8 %   Monocytes Absolute 0.6 0.1 - 1.0 K/uL   Eosinophils Relative 3 %   Eosinophils Absolute 0.2 0.0 - 0.5 K/uL   Basophils Relative 1 %   Basophils Absolute 0.0 0.0 - 0.1 K/uL   Immature Granulocytes 1 %   Abs Immature Granulocytes 0.04 0.00 - 0.07 K/uL    Comment: Performed at [facility], [address], [place], [place] [address]    Blood Alcohol level:  Lab Results  Component Value Date   ETH <10 11/25/2018   ETH <10 05/27/2018     Metabolic Disorder Labs: No results found for: HGBA1C, MPG No results found for: PROLACTIN No results found for: CHOL, TRIG, HDL, CHOLHDL, VLDL, LDLCALC  Physical Findings: AIMS: Facial and Oral Movements Muscles of Facial Expression: None, normal Lips and Perioral Area: None, normal Jaw: None, normal Tongue: None, normal,Extremity Movements Upper (arms, wrists, hands, fingers): None, normal Lower (legs, knees, ankles, toes): None, normal, Trunk Movements Neck, shoulders, hips: None, normal, Overall Severity Severity of abnormal movements (highest score from questions above): None, normal Incapacitation due to abnormal movements: None, normal Patient's awareness of abnormal movements (rate only patient's report): No Awareness, Dental Status Current problems with teeth and/or dentures?: No Does patient usually wear dentures?: No  CIWA:  CIWA-Ar Total: 0 COWS:  COWS Total Score: 0  Musculoskeletal: Strength & Muscle Tone: within normal limits Gait & Station: normal Patient leans: N/A  Psychiatric Specialty Exam: Physical Exam  Nursing note and vitals reviewed. Constitutional: He is oriented to person, place, and time. He appears well-developed and well-nourished.  HENT:  Head: Normocephalic and atraumatic.  Respiratory: Effort normal.  Neurological: He is alert and oriented to person, place, and time.  Psychiatric: He has a normal mood and affect. His behavior is normal.    Review of Systems  Psychiatric/Behavioral: Positive for depression. Negative for hallucinations. The patient is nervous/anxious.   All other systems reviewed and are negative.   Blood pressure (!) 117/106, pulse Marland Kitchen)  139, temperature (!) 97.4 F (36.3 C), temperature source Oral, resp. rate 20, height 5\' 6"  (1.676 m), weight 58.3 kg, SpO2 97 %.Body mass index is 20.74 kg/m.  General Appearance: Disheveled, malodorous  Eye Contact:  Fair  Speech:  Normal Rate  Volume:  Normal  Mood:  Anxious   Affect:  Congruent  Thought Process:  Linear  Orientation:  Full (Time, Place, and Person)  Thought Content:  Logical  Suicidal Thoughts:  No  Homicidal Thoughts:  No  Memory:  Immediate;   Fair Recent;   Fair Remote;   Fair  Judgement:  Impaired  Insight:  Lacking  Psychomotor Activity:  Normal  Concentration:  Concentration: Fair and Attention Span: Fair  Recall:  [employer] of Knowledge:  Fair  Language:  Fair  Akathisia:  Negative  Handed:  Right  AIMS (if indicated):     Assets:  Desire for Improvement Physical Health Resilience  ADL's:  Intact  Cognition:  WNL  Sleep:  Number of Hours: 6.75     Treatment Plan Summary: Daily contact with patient to assess and evaluate symptoms and progress in treatment and Medication management   Continue with current treatment plan on 11/30/2018 as listed below except were noted  1.  Continue Librium 25 mg p.o. 4 times daily as needed withdrawal symptoms with a CIWA greater than 10. 2.  Continue hydroxyzine 25 mg every 6 hours as needed anxiety. 3.  Continue Lamictal 100 mg p.o. daily and 200 mg p.o. nightly for mood stability. 4.  Continue Naprosyn 500 mg p.o. twice daily as needed pain. 5.  Continue Risperdal 2 mg p.o. nightly for sleep and mood stability.  -  Discharge planning-patient has requested an application to [facility] -Patient encouraged to participate within the therapeutic milieu  Oneta Rack, NP 11/30/2018, 9:51 AM

## 2018-11-30 NOTE — [facility] Group Notes (Signed)
[facility] LCSW Group Therapy Note  11/30/2018   10:00-11:00AM  Type of Therapy and Topic:  Group Therapy:  Unhealthy versus Healthy Supports, Which Am I?  Participation Level:  Did Not Attend   Description of Group:  Patients in this group were introduced to the concept that additional supports including self-support are an essential part of recovery.  Initially a discussion was held about the differences between healthy versus unhealthy supports.  Patients were asked to share what unhealthy supports in their lives need to be addressed, as well as what additional healthy supports could be added for greater help in reaching their goals.   A song entitled "My Own Hero" was played and a group discussion ensued in which patients stated they could relate to the song and it inspired them to realize they have be willing to help themselves in order to succeed, because other people cannot achieve sobriety or stability for them.  We discussed adding a variety of healthy supports to address the various needs in patient lives, including becoming more self-supportive.  Therapeutic Goals: 1)  Highlight the differences between healthy and unhealthy supports 2)  Suggest the importance of being a part of one's own support system 2)  Discuss reasons people in one's life may eventually be unable to be continually supportive  3)  Identify the patient's current support system and   4)  elicit commitments to add healthy supports and to become more conscious of being self-supportive   Summary of Patient Progress:  N/A  Therapeutic Modalities:   Motivational Interviewing Activity  Liron Eissler J Grossman-Orr   

## 2018-11-30 NOTE — Plan of Care (Signed)
D: Patient presents malodorous, despite having recently showered. His heart rate is elevated when standing, so I gave him Gatorade for hydration. Patient denies SI/HI/AVH. He reports sleeping well last night, and receiving medication that was helpful. His appetite is good, energy normal and concentration good. He rates his depression, hopelessness and anxiety 5/10. He complains of cravings, agitation, runny nose, chills, and pain in legs 5/10.  A: Patient checked q15 min, and checks reviewed. Reviewed medication changes with patient and educated on side effects. Educated patient on importance of attending group therapy sessions and educated on several coping skills. Encouarged participation in milieu through recreation therapy and attending meals with peers. Support and encouragement provided. Fluids offered. R: Patient receptive to education on medications, and is medication compliant. Patient contracts for safety on the unit. Goal is to be in a "good mood" and do "whatever" it takes.  Problem: Education: Goal: Emotional status will improve Outcome: Progressing Goal: Mental status will improve Outcome: Progressing   Problem: Activity: Goal: Interest or engagement in activities will improve Outcome: Progressing   Problem: Activity: Goal: Sleeping patterns will improve Outcome: Not Progressing

## 2018-11-30 NOTE — [facility] Group Notes (Signed)
[facility] Group Notes:  (Nursing/MHT/Case Management/Adjunct)  Date:  11/30/2018  Time:  6:25 PM  Type of Therapy:  Nurse Education  Participation Level:  Active  Participation Quality:  Appropriate and Attentive  Affect:  Appropriate  Cognitive:  Alert and Appropriate  Insight:  Good  Engagement in Group:  Engaged  Modes of Intervention:  Activity and Discussion  Summary of Progress/Problems: In this group, the RN reviewed healthy coping strategies. The patients were taught why they struggle with symptoms of depression and how to work through them. The patient was cooperative and participated.  Kirstie Mirza 11/30/2018, 6:25 PM

## 2018-11-30 NOTE — Progress Notes (Signed)
Patient attended AA group meeting.  

## 2018-11-30 NOTE — Progress Notes (Signed)
Pt stated he needed to get to his NA meeting tomorrow at 1 pm. Pt said he may need an extra bus pass due to not having transportation.

## 2018-12-01 MED ORDER — HYDROXYZINE HCL 25 MG PO TABS: 25.0000 mg | ORAL_TABLET | Freq: Four times a day (QID) | ORAL | 0 refills | Status: AC | PRN

## 2018-12-01 MED ORDER — NAPROXEN 500 MG PO TABS: 500.0000 mg | ORAL_TABLET | Freq: Two times a day (BID) | ORAL | Status: AC | PRN

## 2018-12-01 MED ORDER — RISPERIDONE 3 MG PO TABS: 3.0000 mg | ORAL_TABLET | Freq: Every day | ORAL | 0 refills | Status: AC

## 2018-12-01 MED ORDER — NICOTINE POLACRILEX 2 MG MT GUM: 2.0000 mg | CHEWING_GUM | OROMUCOSAL | 0 refills | Status: AC | PRN

## 2018-12-01 MED ORDER — LAMOTRIGINE 200 MG PO TABS: 200.0000 mg | ORAL_TABLET | Freq: Every day | ORAL | 0 refills | Status: AC

## 2018-12-01 MED ORDER — RISPERIDONE 3 MG PO TABS
3.0000 mg | ORAL_TABLET | Freq: Every day | ORAL | Status: DC
  Filled 2018-12-01: qty 7

## 2018-12-01 MED ORDER — LAMOTRIGINE 100 MG PO TABS: 100.0000 mg | ORAL_TABLET | Freq: Every day | ORAL | 0 refills | Status: AC

## 2018-12-01 MED ORDER — BACITRACIN-NEOMYCIN-POLYMYXIN OINTMENT TUBE: 1.0000 "application " | TOPICAL_OINTMENT | Freq: Three times a day (TID) | CUTANEOUS | Status: AC

## 2018-12-01 NOTE — Discharge Summary (Signed)
Physician Discharge Summary Note  Patient:  Spencer Rogers is an 23 y.o., male MRN:  [id number] DOB:  20-May-1996 Patient phone:  [phone] (home)  Patient address:   [address] [place] [address],  Total Time spent with patient: 15 minutes  Date of Admission:  11/26/2018 Date of Discharge: 12/01/2018  Reason for Admission:  Overdose on ~30 estazolam tablets  Principal Problem: Benzodiazepine toxicity, accidental or unintentional, sequela Discharge Diagnoses: Principal Problem:   Benzodiazepine toxicity, accidental or unintentional, sequela Active Problems:   Benzodiazepine abuse (HCC)   Posttraumatic stress disorder   Opioid dependence (HCC)   Past Psychiatric History: From admission H&P: Patient stated that he had been in a detox program with the local mental health center and then went to [facility].  After that he went to [facility].  His last psychiatric hospitalization at our facility was 5 years ago.  From review of the electronic medical record it appears as though he has been getting medications from a psychiatrist, but he is significantly sedated this a.m. and hard to get a complete history.  Past Medical History:  Past Medical History:  Diagnosis Date  . Bipolar 1 disorder (HCC)   . History of drug overdose   . Polysubstance abuse (HCC)    opiates, benzos, cocaine, marijuana  . PTSD (post-traumatic stress disorder)   . Substance induced mood disorder [facility])     Past Surgical History:  Procedure Laterality Date  . unknown     Family History:  Family History  Problem Relation Age of Onset  . Sudden Cardiac Death Neg Hx    Family Psychiatric  History: From admission H&P: No pertinent family history reported by the patient. Social History:  Social History   Substance and Sexual Activity  Alcohol Use Not Currently   Comment: occasionally      Social History   Substance and Sexual Activity  Drug Use Yes  . Types: Marijuana, Heroin, Benzodiazepines, IV,  Cocaine   Comment: heroin, benzos    Social History   Socioeconomic History  . Marital status: Single    Spouse name: Not on file  . Number of children: Not on file  . Years of education: Not on file  . Highest education level: Not on file  Occupational History  . Not on file  Social Needs  . Financial resource strain: Not on file  . Food insecurity:    Worry: Not on file    Inability: Not on file  . Transportation needs:    Medical: Not on file    Non-medical: Not on file  Tobacco Use  . Smoking status: Current Every Day Smoker    Packs/day: 1.00    Years: 10.00    Pack years: 10.00    Types: Cigarettes  . Smokeless tobacco: Never Used  Substance and Sexual Activity  . Alcohol use: Not Currently    Comment: occasionally   . Drug use: Yes    Types: Marijuana, Heroin, Benzodiazepines, IV, Cocaine    Comment: heroin, benzos  . Sexual activity: Not Currently    Birth control/protection: None  Lifestyle  . Physical activity:    Days per week: Not on file    Minutes per session: Not on file  . Stress: Not on file  Relationships  . Social connections:    Talks on phone: Not on file    Gets together: Not on file    Attends religious service: Not on file    Active member of club  or organization: Not on file    Attends meetings of clubs or organizations: Not on file    Relationship status: Not on file  Other Topics Concern  . Not on file  Social History Narrative  . Not on file    Hospital Course:  From admission H&P 11/26/2018: Patient is a 23 year old male who was involuntarily committed from the [facility] emergency department after taking approximately 30 tablets of benzodiazepines. He stated those were estazolam. He stated he obtain these over the Internet. He was staying at an [facility], and his roommates found him stumbling over things. EMS was called. Patient had recently gone to [facility], and then [facility] following that. He  reportedly had had 6 months of sobriety up until yesterday. He stated that his trigger was secondary to increased work stress. 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 His last psychiatric hospitalization at our facility was in 2015. He was diagnosed at that time with severe opioid use disorder. There is also some mention of posttraumatic stress disorder. He was discharged on hydroxyzine and trazodone at that time. Currently he still significantly sedated, and is not a great historian. A note from 01/29/2018 from a visit at [facility] health showed him taking clonidine, gabapentin, Lamictal, Phenergan, Risperdal and MiraLAX. He was admitted to the hospital for evaluation and stabilization.  Mr. Vaness was admitted after an overdose on ~30 estazolam tablets. He had also been using kratom. He denied suicidal intent. He was still sedated on admission to [facility] and remained in bed for first several days. CIWA protocol was started for benzo withdrawal with Librium PRN CIWA>10. Clonidine COWS protocol was started for opioid withdrawal. He was continued on Lamictal and Risperdal. PRN Vistaril was started. He initially expressed interest in discharging to [facility] for rehab but later decided to discharge to a friend's home instead. He remained on the [facility] unit for 6 days. He stabilized with medications and therapy. He was discharged on the medications listed below. He has shown improvement with improved mood, affect, sleep, appetite, and interaction. He denies any SI/HI/AVH and contracts for safety. He agrees to follow up at [facility] (see below). Patient is provided with prescriptions and medication samples upon discharge. He is provided with a bus pass to discharge to his friend's home.  Physical Findings: AIMS: Facial and Oral Movements Muscles of Facial Expression: None, normal Lips and Perioral Area: None, normal Jaw: None,  normal Tongue: None, normal,Extremity Movements Upper (arms, wrists, hands, fingers): None, normal Lower (legs, knees, ankles, toes): None, normal, Trunk Movements Neck, shoulders, hips: None, normal, Overall Severity Severity of abnormal movements (highest score from questions above): None, normal Incapacitation due to abnormal movements: None, normal Patient's awareness of abnormal movements (rate only patient's report): No Awareness, Dental Status Current problems with teeth and/or dentures?: No Does patient usually wear dentures?: No  CIWA:  CIWA-Ar Total: 8 COWS:  COWS Total Score: 2  Musculoskeletal: Strength & Muscle Tone: within normal limits Gait & Station: normal Patient leans: N/A  Psychiatric Specialty Exam: Physical Exam  Nursing note and vitals reviewed. Constitutional: He is oriented to person, place, and time. He appears well-developed and well-nourished.  Cardiovascular: Normal rate.  Respiratory: Effort normal.  Neurological: He is alert and oriented to person, place, and time.    Review of Systems  Constitutional: Negative.   Psychiatric/Behavioral: Positive for substance abuse (BZDs, kratom). Negative for depression,  hallucinations, memory loss and suicidal ideas. The patient is not nervous/anxious and does not have insomnia.     Blood pressure 103/78, pulse (!) 102, temperature 98 F (36.7 C), temperature source Oral, resp. rate 20, height 5\' 6"  (1.676 m), weight 58.3 kg, SpO2 97 %.Body mass index is 20.74 kg/m.  See MD's discharge SRA     Have you used any form of tobacco in the last 30 days? (Cigarettes, Smokeless Tobacco, Cigars, and/or Pipes): Yes  Has this patient used any form of tobacco in the last 30 days? (Cigarettes, Smokeless Tobacco, Cigars, and/or Pipes) Yes, a prescription for an FDA-approved medication for tobacco cessation was offered at discharge.   Blood Alcohol level:  Lab Results  Component Value Date   ETH <10 11/25/2018   ETH <10  05/27/2018    Metabolic Disorder Labs:  No results found for: HGBA1C, MPG No results found for: PROLACTIN No results found for: CHOL, TRIG, HDL, CHOLHDL, VLDL, LDLCALC  See Psychiatric Specialty Exam and Suicide Risk Assessment completed by Attending Physician prior to discharge.  Discharge destination:  Home  Is patient on multiple antipsychotic therapies at discharge:  No   Has Patient had three or more failed trials of antipsychotic monotherapy by history:  No  Recommended Plan for Multiple Antipsychotic Therapies: NA  Discharge Instructions    Discharge instructions   Complete by:  As directed    Patient is instructed to take all prescribed medications as recommended. Report any side effects or adverse reactions to your outpatient psychiatrist. Patient is instructed to abstain from alcohol and illegal drugs while on prescription medications. In the event of worsening symptoms, patient is instructed to call the crisis hotline, 911, or go to the nearest emergency department for evaluation and treatment.     Allergies as of 12/01/2018      Reactions   Seroquel [quetiapine Fumarate] Other (See Comments)   Pt said, I can't take Seroquel, it makes me want to kill people.      Medication List    STOP taking these medications   cloNIDine 0.1 MG tablet Commonly known as:  CATAPRES   traZODone 50 MG tablet Commonly known as:  DESYREL     TAKE these medications     Indication  hydrOXYzine 25 MG tablet Commonly known as:  ATARAX/VISTARIL Take 1 tablet (25 mg total) by mouth every 6 (six) hours as needed for anxiety. What changed:    how much to take  how to take this  when to take this  reasons to take this  additional instructions  Indication:  Feeling Anxious   lamoTRIgine 200 MG tablet Commonly known as:  LAMICTAL Take 1 tablet (200 mg total) by mouth at bedtime. For mood What changed:  You were already taking a medication with the same name, and this  prescription was added. Make sure you understand how and when to take each.  Indication:  Mood   lamoTRIgine 100 MG tablet Commonly known as:  LAMICTAL Take 1 tablet (100 mg total) by mouth daily. For mood Start taking on:  December 02, 2018 What changed:    medication strength  how much to take  when to take this  additional instructions  Indication:  Mood   naproxen 500 MG tablet Commonly known as:  NAPROSYN Take 1 tablet (500 mg total) by mouth 2 (two) times daily as needed (aching, pain, or discomfort). What changed:    when to take this  reasons to take this  Indication:  Pain   neomycin-bacitracin-polymyxin Oint Commonly known as:  NEOSPORIN Apply 1 application topically 3 (three) times daily.  Indication:  Cut   nicotine polacrilex 2 MG gum Commonly known as:  NICORETTE Take 1 each (2 mg total) by mouth as needed for smoking cessation.  Indication:  Nicotine Addiction   risperiDONE 3 MG tablet Commonly known as:  RISPERDAL Take 1 tablet (3 mg total) by mouth at bedtime. For mood What changed:    medication strength  how much to take  additional instructions  Indication:  Mood      Follow-up Information    [facility] Follow up on 12/03/2018.   Why:  Hospital follow up appointment with Timonty is Wednesday, 3/4 at 12:30p. Please bring your current medications and discharge paperwork from this hospitalization.  Contact information: [address] [place] [address] [phone]        [facility] Follow up.   Specialty:  Addiction Medicine Why:  Referral faxed 02/28 Contact information: [address] [place] [address] [phone]           Follow-up recommendations: Activity as tolerated. Diet as recommended by primary care physician. Keep all scheduled follow-up appointments as recommended.   Comments:   Patient is instructed to take all prescribed medications as recommended. Report any side effects  or adverse reactions to your outpatient psychiatrist. Patient is instructed to abstain from alcohol and illegal drugs while on prescription medications. In the event of worsening symptoms, patient is instructed to call the crisis hotline, 911, or go to the nearest emergency department for evaluation and treatment.  Signed: Aldean Baker, NP 12/01/2018, 1:13 PM

## 2018-12-01 NOTE — Progress Notes (Signed)
  [facility] Adult Case Management Discharge Plan :  Will you be returning to the same living situation after discharge:  No. Going to go stay with friend's until he can get back into an [facility] At discharge, do you have transportation home?: Yes,  bus pass Do you have the ability to pay for your medications: No. Referred to [facility].  Release of information consent forms completed and in the chart; bus passes and letter on chart.  Patient to Follow up at: Follow-up Information    [facility] Follow up on 12/03/2018.   Why:  Hospital follow up appointment with Timonty is Wednesday, 3/4 at 12:30p. Please bring your current medications and discharge paperwork from this hospitalization.  Contact information: [address] [place] [address] [phone]        [facility] Follow up.   Specialty:  Addiction Medicine Why:  Referral faxed 02/28. Please call to follow up with your referral.  Contact information: [address] [place] [address] [phone]           Next level of care provider has access to [facility] Link:no  Safety Planning and Suicide Prevention discussed: Yes,  with patient and uncle  Have you used any form of tobacco in the last 30 days? (Cigarettes, Smokeless Tobacco, Cigars, and/or Pipes): Yes  Has patient been referred to the Quitline?: Patient refused referral  Patient has been referred for addiction treatment: Yes  Darreld Mclean, LCSWA 12/01/2018, 8:53 AM

## 2018-12-01 NOTE — BHH Suicide Risk Assessment (Signed)
[facility] Discharge Suicide Risk Assessment   Principal Problem: Benzodiazepine toxicity, accidental or unintentional, sequela Discharge Diagnoses: Principal Problem:   Benzodiazepine toxicity, accidental or unintentional, sequela Active Problems:   Benzodiazepine abuse (HCC)   Posttraumatic stress disorder   Opioid dependence (HCC)   Total Time spent with patient: 15 minutes  Musculoskeletal: Strength & Muscle Tone: within normal limits Gait & Station: normal Patient leans: N/A  Psychiatric Specialty Exam: Review of Systems  All other systems reviewed and are negative.   Blood pressure 103/78, pulse (!) 102, temperature 98 F (36.7 C), temperature source Oral, resp. rate 20, height 5\' 6"  (1.676 m), weight 58.3 kg, SpO2 97 %.Body mass index is 20.74 kg/m.  General Appearance: Casual  Eye Contact::  Fair  Speech:  Normal Rate409  Volume:  Normal  Mood:  Anxious  Affect:  Congruent  Thought Process:  Coherent and Descriptions of Associations: Intact  Orientation:  Full (Time, Place, and Person)  Thought Content:  Logical  Suicidal Thoughts:  No  Homicidal Thoughts:  No  Memory:  Immediate;   Fair Recent;   Fair Remote;   Fair  Judgement:  Intact  Insight:  Lacking  Psychomotor Activity:  Increased  Concentration:  Fair  Recall:  [employer] of Knowledge:Fair  Language: Fair  Akathisia:  Negative  Handed:  Right  AIMS (if indicated):     Assets:  Desire for Improvement Physical Health Resilience  Sleep:  Number of Hours: 5.75  Cognition: WNL  ADL's:  Intact   Mental Status Per Nursing Assessment::   On Admission:  NA  Demographic Factors:  Male, Low socioeconomic status and Unemployed  Loss Factors: NA  Historical Factors: Impulsivity  Risk Reduction Factors:   NA  Continued Clinical Symptoms:  Bipolar Disorder:   Bipolar II Alcohol/Substance Abuse/Dependencies  Cognitive Features That Contribute To Risk:  None    Suicide Risk:  Minimal: No  identifiable suicidal ideation.  Patients presenting with no risk factors but with morbid ruminations; may be classified as minimal risk based on the severity of the depressive symptoms  Follow-up Information    [facility] Follow up on 12/03/2018.   Why:  Hospital follow up appointment with Timonty is Wednesday, 3/4 at 12:30p. Please bring your current medications and discharge paperwork from this hospitalization.  Contact information: [address] [place] [address] [phone]        [facility] Follow up.   Specialty:  Addiction Medicine Why:  Referral faxed 02/28 Contact information: [address] [place] [address] [phone]           Plan Of Care/Follow-up recommendations:  Activity:  ad lib  Antonieta Pert, MD 12/01/2018, 7:27 AM

## 2018-12-01 NOTE — Plan of Care (Signed)
Discharge note  Patient verbalizes readiness for discharge. Follow up plan explained, AVS, Transition record and SRA given. Prescriptions and teaching provided. Belongings returned and signed for. Suicide safety plan completed and signed. Patient verbalizes understanding. Patient denies SI/HI and assures this Clinical research associate he will seek assistance should that change. Patient discharged to lobby with two bus passes and instruction on which stop to wait at.  Problem: Education: Goal: Knowledge of [place] Education information/materials will improve Outcome: Adequate for Discharge Goal: Emotional status will improve Outcome: Adequate for Discharge Goal: Mental status will improve Outcome: Adequate for Discharge Goal: Verbalization of understanding the information provided will improve Outcome: Adequate for Discharge   Problem: Activity: Goal: Interest or engagement in activities will improve Outcome: Adequate for Discharge Goal: Sleeping patterns will improve Outcome: Adequate for Discharge   Problem: Coping: Goal: Ability to verbalize frustrations and anger appropriately will improve Outcome: Adequate for Discharge Goal: Ability to demonstrate self-control will improve Outcome: Adequate for Discharge   Problem: Health Behavior/Discharge Planning: Goal: Identification of resources available to assist in meeting health care needs will improve Outcome: Adequate for Discharge Goal: Compliance with treatment plan for underlying cause of condition will improve Outcome: Adequate for Discharge   Problem: Physical Regulation: Goal: Ability to maintain clinical measurements within normal limits will improve Outcome: Adequate for Discharge   Problem: Safety: Goal: Periods of time without injury will increase Outcome: Adequate for Discharge   Problem: Education: Goal: Knowledge of disease or condition will improve Outcome: Adequate for Discharge Goal: Understanding of discharge needs  will improve Outcome: Adequate for Discharge   Problem: Health Behavior/Discharge Planning: Goal: Ability to identify changes in lifestyle to reduce recurrence of condition will improve Outcome: Adequate for Discharge Goal: Identification of resources available to assist in meeting health care needs will improve Outcome: Adequate for Discharge   Problem: Physical Regulation: Goal: Complications related to the disease process, condition or treatment will be avoided or minimized Outcome: Adequate for Discharge   Problem: Safety: Goal: Ability to remain free from injury will improve Outcome: Adequate for Discharge   Problem: Education: Goal: Ability to make informed decisions regarding treatment will improve Outcome: Adequate for Discharge   Problem: Coping: Goal: Coping ability will improve Outcome: Adequate for Discharge   Problem: Health Behavior/Discharge Planning: Goal: Identification of resources available to assist in meeting health care needs will improve Outcome: Adequate for Discharge   Problem: Medication: Goal: Compliance with prescribed medication regimen will improve Outcome: Adequate for Discharge   Problem: Self-Concept: Goal: Ability to disclose and discuss suicidal ideas will improve Outcome: Adequate for Discharge Goal: Will verbalize positive feelings about self Outcome: Adequate for Discharge

## 2018-12-01 NOTE — Tx Team (Signed)
Interdisciplinary Treatment and Diagnostic Plan Update  12/01/2018 Time of Session: 10:00am Spencer Rogers MRN: [id number]  Principal Diagnosis: Benzodiazepine toxicity, accidental or unintentional, sequela  Secondary Diagnoses: Principal Problem:   Benzodiazepine toxicity, accidental or unintentional, sequela Active Problems:   Benzodiazepine abuse (HCC)   Posttraumatic stress disorder   Opioid dependence ([place])   Current Medications:  Current Facility-Administered Medications  Medication Dose Route Frequency Provider Last Rate Last Dose  . feeding supplement (ENSURE ENLIVE) (ENSURE ENLIVE) liquid 237 mL  237 mL Oral BID BM Sharma Covert, MD   237 mL at 11/30/18 1415  . lamoTRIgine (LAMICTAL) tablet 100 mg  100 mg Oral Daily Lindon Romp A, NP   100 mg at 12/01/18 0733  . lamoTRIgine (LAMICTAL) tablet 200 mg  200 mg Oral QHS Sharma Covert, MD   200 mg at 11/30/18 2217  . magnesium hydroxide (MILK OF MAGNESIA) suspension 30 mL  30 mL Oral Daily PRN Patriciaann Clan E, PA-C      . neomycin-bacitracin-polymyxin (NEOSPORIN) ointment   Topical TID Derrill Center, NP   1 application at 32/12/24 1816  . nicotine polacrilex (NICORETTE) gum 2 mg  2 mg Oral PRN Sharma Covert, MD   2 mg at 12/01/18 8250  . risperiDONE (RISPERDAL) tablet 3 mg  3 mg Oral QHS Mallie Darting Cordie Grice, MD       PTA Medications: Medications Prior to Admission  Medication Sig Dispense Refill Last Dose  . cloNIDine (CATAPRES) 0.1 MG tablet Take 0.1 mg by mouth at bedtime.    11/24/2018 at Unknown time  . hydrOXYzine (ATARAX/VISTARIL) 25 MG tablet Take 1 tablet (25 mg) three times daily as needed: For anxiety (Patient not taking: Reported on 11/25/2018) 45 tablet 0 Not Taking at Unknown time  . lamoTRIgine (LAMICTAL) 200 MG tablet Take 100-200 mg by mouth See admin instructions. 100 mg in the am and 200 mg at bedtime   11/25/2018 at Unknown time  . naproxen (NAPROSYN) 500 MG tablet Take 1 tablet (500 mg total) by  mouth 2 (two) times daily. (Patient not taking: Reported on 11/25/2018) 30 tablet 0 Not Taking at Unknown time  . risperidone (RISPERDAL) 4 MG tablet Take 4 mg by mouth at bedtime.    11/24/2018 at Unknown time  . traZODone (DESYREL) 50 MG tablet Take 1 tablet (50 mg total) by mouth at bedtime as needed for sleep. (Patient not taking: Reported on 11/25/2018) 30 tablet 0 Not Taking at Unknown time    Patient Stressors: Financial difficulties Substance abuse  Patient Strengths: Average or above average intelligence General fund of knowledge Supportive family/friends  Treatment Modalities: Medication Management, Group therapy, Case management,  1 to 1 session with clinician, Psychoeducation, Recreational therapy.   Physician Treatment Plan for Primary Diagnosis: Benzodiazepine toxicity, accidental or unintentional, sequela Long Term Goal(s): Improvement in symptoms so as ready for discharge Improvement in symptoms so as ready for discharge   Short Term Goals: Ability to identify changes in lifestyle to reduce recurrence of condition will improve Ability to verbalize feelings will improve Ability to disclose and discuss suicidal ideas Ability to demonstrate self-control will improve Ability to identify and develop effective coping behaviors will improve Ability to maintain clinical measurements within normal limits will improve Compliance with prescribed medications will improve Ability to identify triggers associated with substance abuse/mental health issues will improve Ability to identify changes in lifestyle to reduce recurrence of condition will improve Ability to verbalize feelings will improve Ability to disclose and discuss  suicidal ideas Ability to demonstrate self-control will improve Ability to identify and develop effective coping behaviors will improve Ability to maintain clinical measurements within normal limits will improve Compliance with prescribed medications will  improve Ability to identify triggers associated with substance abuse/mental health issues will improve  Medication Management: Evaluate patient's response, side effects, and tolerance of medication regimen.  Therapeutic Interventions: 1 to 1 sessions, Unit Group sessions and Medication administration.  Evaluation of Outcomes: Not Met  Physician Treatment Plan for Secondary Diagnosis: Principal Problem:   Benzodiazepine toxicity, accidental or unintentional, sequela Active Problems:   Benzodiazepine abuse ([place])   Posttraumatic stress disorder   Opioid dependence ([place])  Long Term Goal(s): Improvement in symptoms so as ready for discharge Improvement in symptoms so as ready for discharge   Short Term Goals: Ability to identify changes in lifestyle to reduce recurrence of condition will improve Ability to verbalize feelings will improve Ability to disclose and discuss suicidal ideas Ability to demonstrate self-control will improve Ability to identify and develop effective coping behaviors will improve Ability to maintain clinical measurements within normal limits will improve Compliance with prescribed medications will improve Ability to identify triggers associated with substance abuse/mental health issues will improve Ability to identify changes in lifestyle to reduce recurrence of condition will improve Ability to verbalize feelings will improve Ability to disclose and discuss suicidal ideas Ability to demonstrate self-control will improve Ability to identify and develop effective coping behaviors will improve Ability to maintain clinical measurements within normal limits will improve Compliance with prescribed medications will improve Ability to identify triggers associated with substance abuse/mental health issues will improve     Medication Management: Evaluate patient's response, side effects, and tolerance of medication regimen.  Therapeutic Interventions: 1 to 1 sessions,  Unit Group sessions and Medication administration.  Evaluation of Outcomes: Adequate for Discharge   RN Treatment Plan for Primary Diagnosis: Benzodiazepine toxicity, accidental or unintentional, sequela Long Term Goal(s): Knowledge of disease and therapeutic regimen to maintain health will improve  Short Term Goals: Ability to demonstrate self-control, Ability to verbalize feelings will improve and Ability to identify and develop effective coping behaviors will improve  Medication Management: RN will administer medications as ordered by provider, will assess and evaluate patient's response and provide education to patient for prescribed medication. RN will report any adverse and/or side effects to prescribing provider.  Therapeutic Interventions: 1 on 1 counseling sessions, Psychoeducation, Medication administration, Evaluate responses to treatment, Monitor vital signs and CBGs as ordered, Perform/monitor CIWA, COWS, AIMS and Fall Risk screenings as ordered, Perform wound care treatments as ordered.  Evaluation of Outcomes: Adequate for Discharge   LCSW Treatment Plan for Primary Diagnosis: Benzodiazepine toxicity, accidental or unintentional, sequela Long Term Goal(s): Safe transition to appropriate next level of care at discharge, Engage patient in therapeutic group addressing interpersonal concerns.  Short Term Goals: Engage patient in aftercare planning with referrals and resources, Increase social support, Identify triggers associated with mental health/substance abuse issues and Increase skills for wellness and recovery  Therapeutic Interventions: Assess for all discharge needs, 1 to 1 time with Social worker, Explore available resources and support systems, Assess for adequacy in community support network, Educate family and significant other(s) on suicide prevention, Complete Psychosocial Assessment, Interpersonal group therapy.  Evaluation of Outcomes: Adequate for  Discharge   Progress in Treatment: Attending groups: Yes. Participating in groups: Yes. Taking medication as prescribed: Yes. Toleration medication: Yes. Family/Significant other contact made: Yes, individual(s) contacted:  uncle Patient understands diagnosis: Yes. Discussing patient  identified problems/goals with staff: No. Medical problems stabilized or resolved: Yes. Denies suicidal/homicidal ideation: Yes. Issues/concerns per patient self-inventory: Yes.  New problem(s) identified: No, Describe:  CSW continuing to assess. Patient has been living in an [place], [place] unsure if patient is able to return there at this time.  New Short Term/Long Term Goal(s): detox, medication management for mood stabilization; elimination of SI thoughts; development of comprehensive mental wellness/sobriety plan.  Patient Goals:  Detox in a safe environment  Discharge Plan or Barriers: Going to stay with friends for a couple of days and then hoping to return to his old [place]. Follow up with [facility] outpatient and [facility] referral  Reason for Continuation of Hospitalization: Anxiety  Estimated Length of Stay: today  Attendees: Patient: Spencer Rogers 12/01/2018 8:59 AM  Physician:  12/01/2018 8:59 AM  Nursing:  12/01/2018 8:59 AM  RN Care Manager: 12/01/2018 8:59 AM  Social Worker: Stephanie Acre, [place] 12/01/2018 8:59 AM  Recreational Therapist:  12/01/2018 8:59 AM  Other:  12/01/2018 8:59 AM  Other:  12/01/2018 8:59 AM  Other: 12/01/2018 8:59 AM    Scribe for Treatment Team: Joellen [place], [place] 12/01/2018 8:59 AM

## 2019-03-17 ENCOUNTER — Encounter (HOSPITAL_COMMUNITY): Payer: Self-pay | Admitting: Emergency Medicine

## 2019-03-17 ENCOUNTER — Emergency Department (HOSPITAL_COMMUNITY)
Admission: EM | Admit: 2019-03-17 | Discharge: 2019-03-17 | Disposition: A | Discharge status: Home / Self Care | Payer: Self-pay | Attending: Emergency Medicine | Admitting: Emergency Medicine

## 2019-03-17 ENCOUNTER — Other Ambulatory Visit: Payer: Self-pay

## 2019-03-17 DIAGNOSIS — R070 Pain in throat: Secondary | ICD-10-CM | POA: Insufficient documentation

## 2019-03-17 DIAGNOSIS — F1310 Sedative, hypnotic or anxiolytic abuse, uncomplicated: Secondary | ICD-10-CM | POA: Insufficient documentation

## 2019-03-17 DIAGNOSIS — Z79899 Other long term (current) drug therapy: Secondary | ICD-10-CM | POA: Insufficient documentation

## 2019-03-17 DIAGNOSIS — F1210 Cannabis abuse, uncomplicated: Secondary | ICD-10-CM | POA: Insufficient documentation

## 2019-03-17 DIAGNOSIS — T887XXA Unspecified adverse effect of drug or medicament, initial encounter: Secondary | ICD-10-CM | POA: Insufficient documentation

## 2019-03-17 DIAGNOSIS — F17210 Nicotine dependence, cigarettes, uncomplicated: Secondary | ICD-10-CM | POA: Insufficient documentation

## 2019-03-17 DIAGNOSIS — F1410 Cocaine abuse, uncomplicated: Secondary | ICD-10-CM | POA: Insufficient documentation

## 2019-03-17 DIAGNOSIS — F1910 Other psychoactive substance abuse, uncomplicated: Secondary | ICD-10-CM | POA: Insufficient documentation

## 2019-03-17 DIAGNOSIS — F309 Manic episode, unspecified: Secondary | ICD-10-CM | POA: Insufficient documentation

## 2019-03-17 DIAGNOSIS — Y69 Unspecified misadventure during surgical and medical care: Secondary | ICD-10-CM | POA: Insufficient documentation

## 2019-03-17 DIAGNOSIS — F1110 Opioid abuse, uncomplicated: Secondary | ICD-10-CM | POA: Insufficient documentation

## 2019-03-17 NOTE — ED Provider Notes (Signed)
[facility]-EMERGENCY DEPT Provider Note   CSN: [id number] Arrival date & time: 03/17/19  1601    History   Chief Complaint Chief Complaint  Patient presents with  . Medication Reaction    HPI Spencer Rogers is a 23 y.o. male.     23 yo M with a chief complaint of possible medication side effect.  The patient was recently started on Abilify for his bipolar disorder.  States that since then he has been having worsening mania has been hypersexual and has had a feeling like he is having trouble swallowing.  Going on for about 2 days now.  Had called his psychiatrist but was unable to get into see him until next week.  Came to the ED for evaluation.  Denies chest pain shortness of breath abdominal pain vomiting diarrhea abdominal pain.  Denies suicidal or homicidal ideation.  Denies hallucinations.  The history is provided by the patient.  Illness Severity:  Moderate Onset quality:  Gradual Duration:  2 days Timing:  Constant Progression:  Worsening Chronicity:  New Associated symptoms: no abdominal pain, no chest pain, no congestion, no diarrhea, no fever, no headaches, no myalgias, no rash, no shortness of breath and no vomiting     Past Medical History:  Diagnosis Date  . Bipolar 1 disorder (HCC)   . History of drug overdose   . Polysubstance abuse (HCC)    opiates, benzos, cocaine, marijuana  . PTSD (post-traumatic stress disorder)   . Substance induced mood disorder [facility](HCC)     Patient Active Problem List   Diagnosis Date Noted  . Benzodiazepine toxicity, accidental or unintentional, sequela 11/28/2018  . Opioid dependence (HCC) 11/28/2018  . MDD (major depressive disorder), recurrent episode, severe (HCC) 11/26/2018  . Rhabdomyolysis 01/08/2017  . CAP (community acquired pneumonia) 01/08/2017  . 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 Transaminasemia 01/08/2017  . Accidental overdose of heroin (HCC)   . Lower extremity weakness   .  Posttraumatic stress disorder 05/22/2014  . Suicidal ideations 05/22/2014  . Bereavement 05/22/2014  . Substance induced mood disorder (HCC) 05/22/2014  . Severe opioid use disorder (HCC) 05/22/2014  . Opiate overdose (HCC) 05/18/2014  . Cocaine abuse (HCC) 05/18/2014  . Cannabis abuse 05/18/2014  . Benzodiazepine abuse (HCC) 05/18/2014    Past Surgical History:  Procedure Laterality Date  . unknown          Home Medications    Prior to Admission medications   Medication Sig Start Date End Date Taking? Authorizing Provider  hydrOXYzine (ATARAX/VISTARIL) 25 MG tablet Take 1 tablet (25 mg total) by mouth every 6 (six) hours as needed for anxiety. 12/01/18   Aldean BakerSykes, Janet E, NP  lamoTRIgine (LAMICTAL) 100 MG tablet Take 1 tablet (100 mg total) by mouth daily. For mood 12/02/18   Aldean BakerSykes, Janet E, NP  lamoTRIgine (LAMICTAL) 200 MG tablet Take 1 tablet (200 mg total) by mouth at bedtime. For mood 12/01/18   Aldean BakerSykes, Janet E, NP  naproxen (NAPROSYN) 500 MG tablet Take 1 tablet (500 mg total) by mouth 2 (two) times daily as needed (aching, pain, or discomfort). 12/01/18   Aldean BakerSykes, Janet E, NP  neomycin-bacitracin-polymyxin (NEOSPORIN) OINT Apply 1 application topically 3 (three) times daily. 12/01/18   Aldean BakerSykes, Janet E, NP  nicotine polacrilex (NICORETTE) 2 MG gum Take 1 each (2 mg total) by mouth as needed for smoking cessation. 12/01/18   Aldean BakerSykes, Janet E, NP  risperiDONE (RISPERDAL) 3 MG tablet Take 1 tablet (3  mg total) by mouth at bedtime. For mood 12/01/18   Aldean BakerSykes, Janet E, NP    Family History Family History  Problem Relation Age of Onset  . Sudden Cardiac Death Neg Hx     Social History Social History   Tobacco Use  . Smoking status: Current Every Day Smoker    Packs/day: 1.00    Years: 10.00    Pack years: 10.00    Types: Cigarettes  . Smokeless tobacco: Never Used  Substance Use Topics  . Alcohol use: Not Currently    Comment: occasionally   . Drug use: Yes    Types: Marijuana, Heroin,  Benzodiazepines, IV, Cocaine    Comment: heroin, benzos     Allergies   Seroquel [quetiapine fumarate]   Review of Systems Review of Systems  Constitutional: Negative for chills and fever.  HENT: Positive for trouble swallowing. Negative for congestion and facial swelling.   Eyes: Negative for discharge and visual disturbance.  Respiratory: Negative for shortness of breath.   Cardiovascular: Negative for chest pain and palpitations.  Gastrointestinal: Negative for abdominal pain, diarrhea and vomiting.  Musculoskeletal: Negative for arthralgias and myalgias.  Skin: Negative for color change and rash.  Neurological: Negative for tremors, syncope and headaches.  Psychiatric/Behavioral: Negative for confusion and dysphoric mood.     Physical Exam Updated Vital Signs BP (!) 118/93 (BP Location: Right Arm)   Pulse 72   Temp 98.4 F (36.9 C) (Oral)   Resp 18   SpO2 99%   Physical Exam Vitals signs and nursing note reviewed.  Constitutional:      Appearance: He is well-developed.  HENT:     Head: Normocephalic and atraumatic.     Comments: Tolerating secretions without difficulty.  No erythema to the posterior oropharynx.  Moves his neck in all directions without pain. Eyes:     Pupils: Pupils are equal, round, and reactive to light.  Neck:     Musculoskeletal: Normal range of motion and neck supple.     Vascular: No JVD.  Cardiovascular:     Rate and Rhythm: Normal rate and regular rhythm.     Heart sounds: No murmur. No friction rub. No gallop.   Pulmonary:     Effort: No respiratory distress.     Breath sounds: No wheezing.  Abdominal:     General: There is no distension.     Tenderness: There is no guarding or rebound.  Musculoskeletal: Normal range of motion.  Skin:    Coloration: Skin is not pale.     Findings: No rash.  Neurological:     Mental Status: He is alert and oriented to person, place, and time.  Psychiatric:        Behavior: Behavior normal.       ED Treatments / Results  Labs (all labs ordered are listed, but only abnormal results are displayed) Labs Reviewed - No data to display  EKG None  Radiology No results found.  Procedures Procedures (including critical care time)  Medications Ordered in ED Medications - No data to display   Initial Impression / Assessment and Plan / ED Course  I have reviewed the triage vital signs and the nursing notes.  Pertinent labs & imaging results that were available during my care of the patient were reviewed by me and considered in my medical decision making (see chart for details).        23 yo M with a chief complaints of a possible medication side effect.  Patient  is having some mania.  I discussed what limited resources we have here in the emergency department for evaluation.  At this point he is declining TTS evaluation.  He will follow-up with his psychiatrist in the office.  I urged him to return for any symptoms that he was concerned about or if he felt like he may hurt himself or someone else.  5:58 PM:  I have discussed the diagnosis/risks/treatment options with the patient and believe the pt to be eligible for discharge home to follow-up with PCP, psych. We also discussed returning to the ED immediately if new or worsening sx occur. We discussed the sx which are most concerning (e.g., sudden worsening pain, fever, inability to tolerate by mouth) that necessitate immediate return. Medications administered to the patient during their visit and any new prescriptions provided to the patient are listed below.  Medications given during this visit Medications - No data to display   The patient appears reasonably screen and/or stabilized for discharge and I doubt any other medical condition or other [facility] requiring further screening, evaluation, or treatment in the ED at this time prior to discharge.    Final Clinical Impressions(s) / ED Diagnoses   Final diagnoses:  [facility])   Medication side effect    ED Discharge Orders    None       Deno Etienne, DO 03/17/19 1758

## 2019-03-17 NOTE — ED Triage Notes (Signed)
Pt reports that last week started having reactions to the abilify is taking on Thursday. Having increased sex drive, disassociation, not sleeping. Reports his therapist isnt in this week to be seen and [facility] couldn't see him him today.

## 2019-03-17 NOTE — Discharge Instructions (Signed)
Return if you feel unsafe at home, or if you start feeling like you might hurt yourself or someone else.

## 2019-12-21 IMAGING — CR DG CHEST 2V
2 series · 2 of 2 positions shown · non-contrast
Comparison: Radiographs 03/06/2015.

CLINICAL DATA: Tachycardia for 3 days. Left chest pain and dizzy
spells.

EXAM:
CHEST - 2 VIEW

[chest pa]
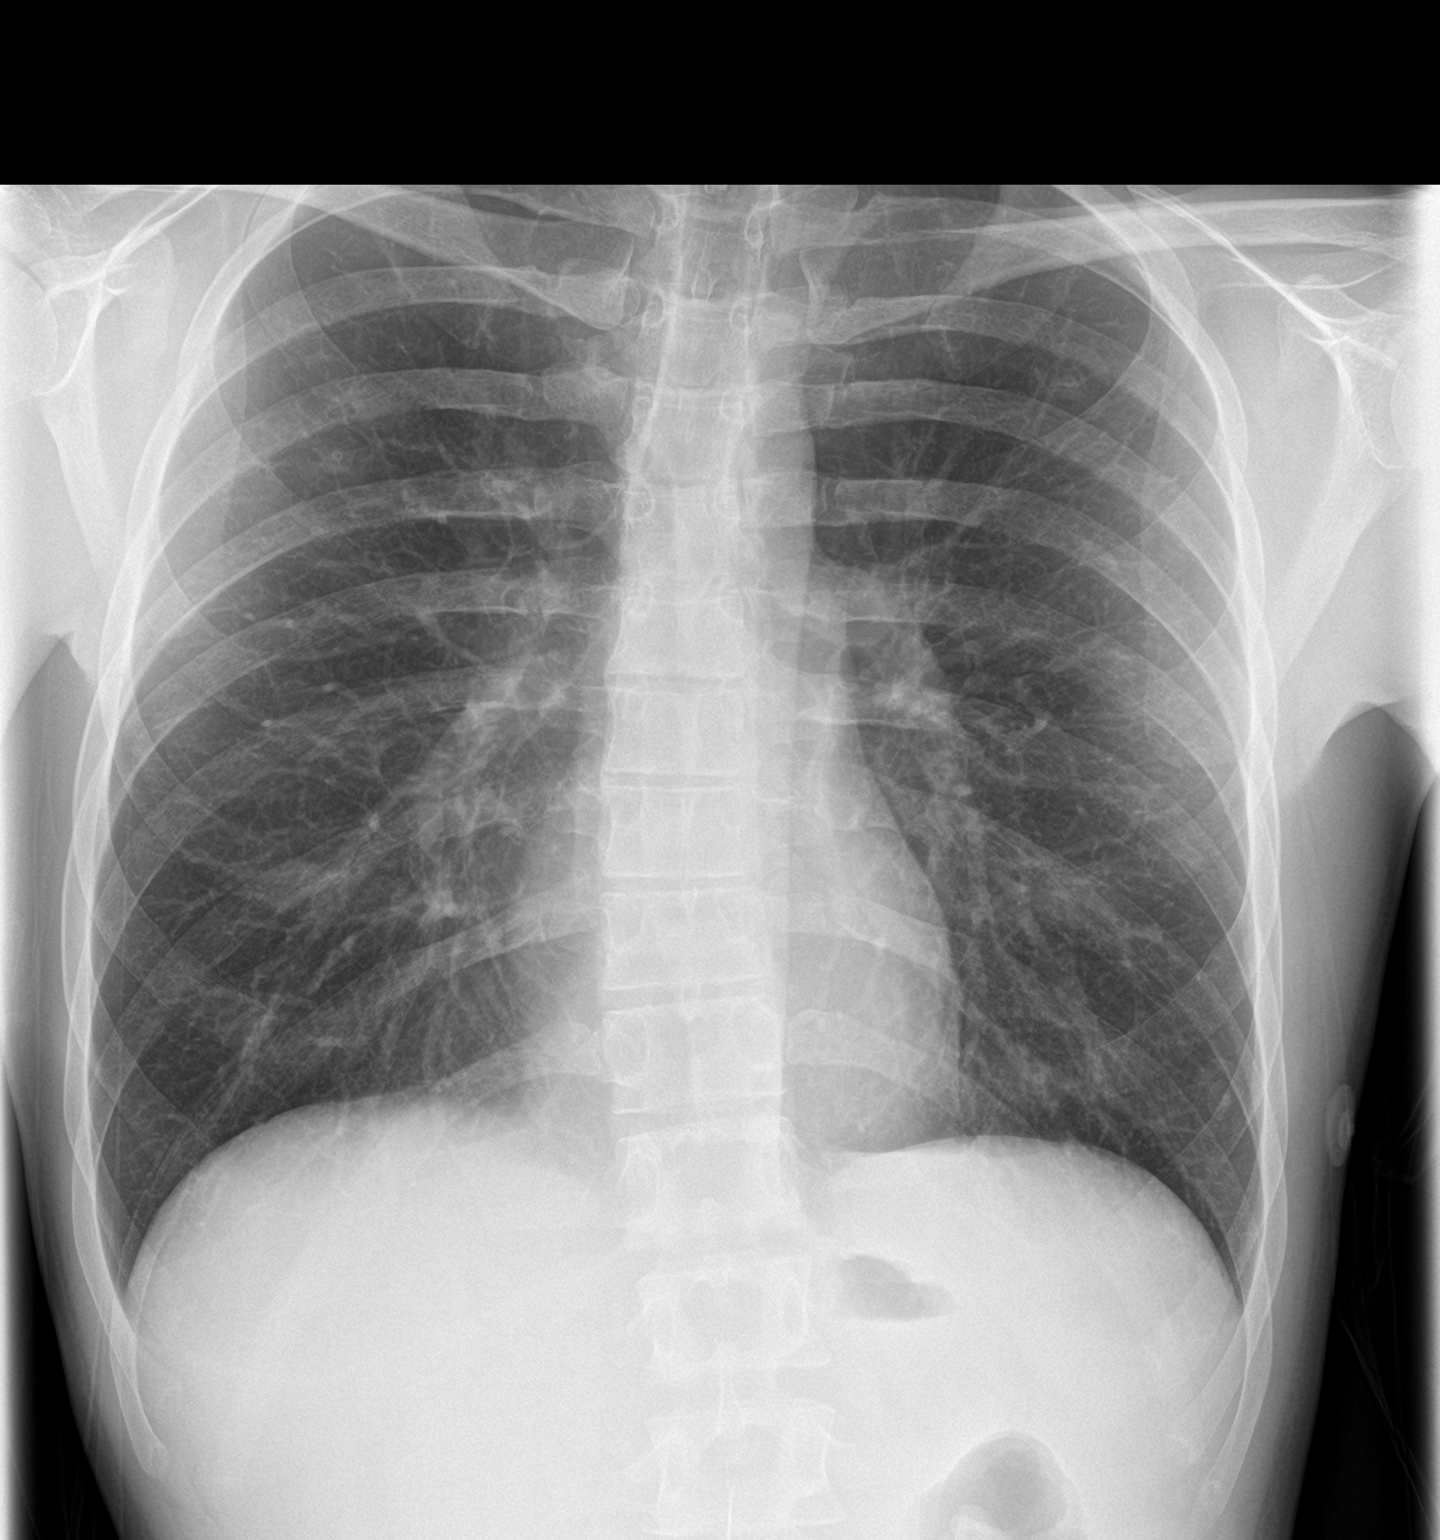

[chest lat]
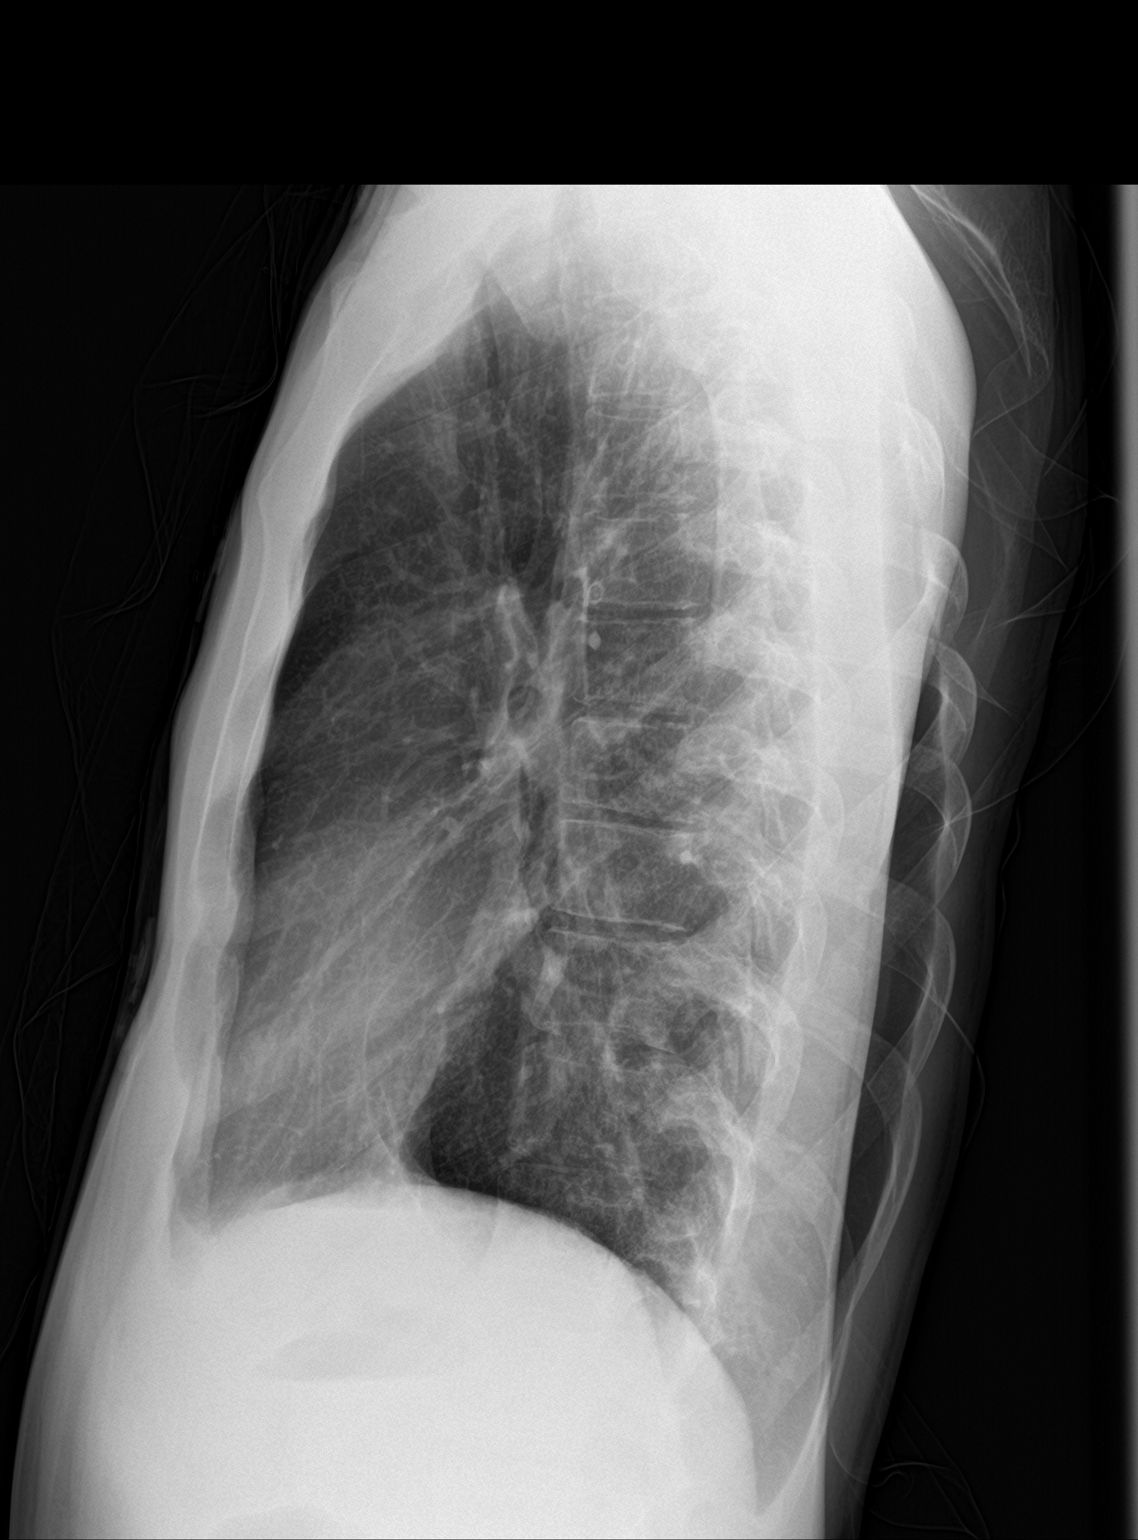

[2 of 2 positions shown; findings below may reference images not displayed]

FINDINGS: The heart size and mediastinal contours are normal. The lungs are
clear. There is no pleural effusion or pneumothorax. No acute
osseous findings are identified. Several EKG snaps overlie the
chest. There is a mild convex right thoracic scoliosis which may be
positional.
IMPRESSION: No active cardiopulmonary process.

## 2020-07-19 IMAGING — DX DG CHEST 1V PORT
1 series · 1 of 1 positions shown · non-contrast
Comparison: 05/27/2018

CLINICAL DATA: 22-year-old male with a history of lethargy and
confusion

EXAM:
PORTABLE CHEST 1 VIEW

[chest ap]
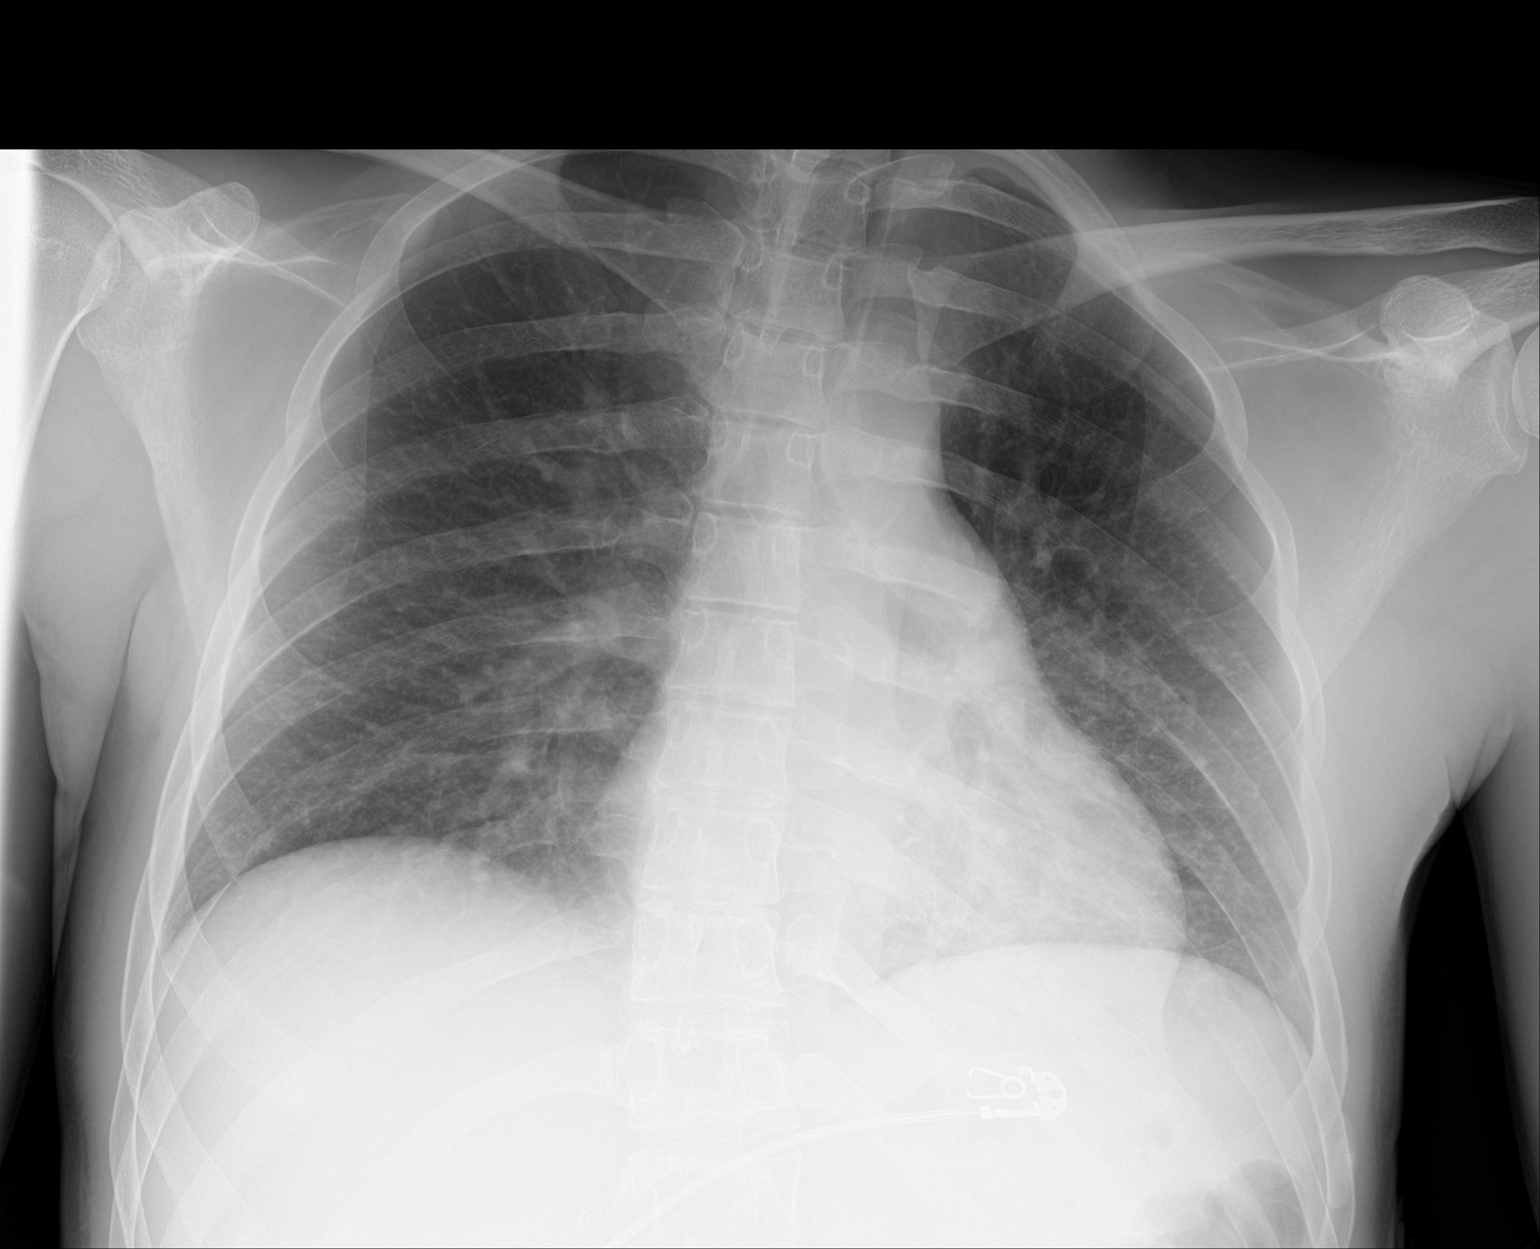

[1 of 1 positions shown; findings below may reference images not displayed]

FINDINGS: The heart size and mediastinal contours are within normal limits.
Both lungs are clear. The visualized skeletal structures are
unremarkable.
IMPRESSION: Negative for acute cardiopulmonary disease

## 2022-08-29 NOTE — Telephone Encounter (Signed)
[place]    Received call from [facility] at [facility]; Patient with [employer] Complaint requesting to establish care with [facility].    Subjective: Caller states "sores and itching, pain in genital area"     Current Symptoms: See above, a year or 2 ago, he noticed he had blister looking bumps in the genital area that were itchy and burned. Right now, has no symptoms. He would have outbreaks for a few weeks to a month, then would clear up, and then they come back. It has been a few months since he had symptoms.    Onset: 1-2 years ago; intermittent    Associated Symptoms: NA    Pain Severity: 0/10; N/A; none    Temperature: n/a     What has been tried: nothing    LMP: NA Pregnant: NA    Recommended disposition: See in Office Today or Tomorrow, advised to proceed to [facility] if unable to obtain a new patient appointment during this time frame    Care advice provided, patient verbalizes understanding; denies any other questions or concerns; instructed to call back for any new or worsening symptoms.    Patient/Caller agrees with recommended disposition; Probation officer provided warm transfer to [employer] at [employer] for appointment scheduling    Attention Provider:  Thank you for allowing me to participate in the care of your patient.  The patient was connected to triage in response to information provided to the ECC.  Please do not respond through this encounter as the response is not directed to a shared pool.    Reason for Disposition   ALL other penis - scrotum symptoms  (Exception: Painless rash < 24 hours duration.)    Protocols used: Penis and Scrotum Symptoms-ADULT-[place]

## 2022-10-17 ENCOUNTER — Encounter: Attending: Student in an Organized Health Care Education/Training Program

## 2024-10-21 ENCOUNTER — Ambulatory Visit: Admit: 2024-10-21 | Discharge: 2024-10-21 | Payer: PRIVATE HEALTH INSURANCE

## 2024-10-21 MED ORDER — TRIAMCINOLONE ACETONIDE 0.1 % EX OINT
0.1 | Freq: Two times a day (BID) | CUTANEOUS | 0 refills | Status: AC
Start: 2024-10-21 — End: 2024-11-04

## 2024-10-21 NOTE — Progress Notes (Addendum)
 10/21/2024   Joseph Wells (DOB: [birth year]) is a 29 y.o. male, here for evaluation of the following chief complaint(s):  Rash on Left Wrist (Patient has developed a painful rash on his left wrist, over the past 6-8 months. The rash is red, scabbing, flaking, dry, full of patches, sore, and itching has developed. )   as documented by Medical Assistant.     ASSESSMENT/PLAN:  Below is the assessment and plan developed based on review of pertinent history, physical exam, labs, studies, and medications.       Assessment & Plan  Eczema, unspecified type       Orders:    [facility] -  Brutus Rick, MD, Dermatology, [facility])        Procedures:   Procedures    MDM: This patient who presents with rash most consistent with eczema. History and exam findings not consistent with dangerous etiologies of rash such as SJS/TEN, or secondary dangerous causes such as petechial rashes from thrombocytopenia or rickettsial infections. No signs of anaphylaxis present. Plan currently is to treat symptomatically and with topical steroid and oral antihistamines. Patient instructed to follow up with PCP and/or dermatology. Return precautions and ER precautions provided in discharge and discussed.  Patient verbalized understanding and agreement with care plan.     Handout given with care instructions  OTC for symptom management. Increase fluid intake, ensure adequate nutritional intake.  Follow up with PCP in 5-7 days if symptoms persist.  Go to ED with development of any acute symptoms.     Follow up:  No follow-ups on file.  Follow up immediately for any new, worsening or changes or if symptoms are not improving over the next 5-7 days.       SUBJECTIVE/OBJECTIVE:  Rash on Left Wrist (Patient has developed a painful rash on his left wrist, over the past 6-8 months. The rash is red, scabbing, flaking, dry, full of patches, sore, and itching has developed. )   as documented by Medical Assistant.   Patient presents with a itchy raised rash on  his left forearm that developed 6 to 8 months ago.  He states that he has had no drainage from the area however it has been intensely itchy.      History provided by:  Patient     Past encounters or results reviewed:   External Records Reviewed: None    Review of Systems    Physical Exam  Vitals and nursing note reviewed.   Constitutional:       General: He is not in acute distress.     Appearance: Normal appearance. He is not ill-appearing, toxic-appearing or diaphoretic.   HENT:      Right Ear: Tympanic membrane, ear canal and external ear normal.      Left Ear: Tympanic membrane, ear canal and external ear normal.      Nose: Nose normal. No congestion or rhinorrhea.      Mouth/Throat:      Mouth: Mucous membranes are moist.      Pharynx: Oropharynx is clear. No oropharyngeal exudate or posterior oropharyngeal erythema.   Eyes:      General: No scleral icterus.        Right eye: No discharge.         Left eye: No discharge.      Conjunctiva/sclera: Conjunctivae normal.   Cardiovascular:      Rate and Rhythm: Normal rate and regular rhythm.      Heart sounds: Normal heart sounds.  Pulmonary:      Effort: Pulmonary effort is normal. No tachypnea, accessory muscle usage, prolonged expiration, respiratory distress or retractions.      Breath sounds: Normal breath sounds and air entry. No stridor, decreased air movement or transmitted upper airway sounds. No decreased breath sounds.   Musculoskeletal:      Cervical back: Normal range of motion and neck supple.   Skin:     General: Skin is warm and dry.      Capillary Refill: Capillary refill takes less than 2 seconds.      Findings: Lesion and rash present.        Neurological:      Mental Status: He is alert and oriented to person, place, and time.        Vitals:    10/21/24 1504   BP: 99/63   BP Site: Left Upper Arm   Patient Position: Sitting   BP Cuff Size: Medium Adult   Pulse: 61   Resp: 16   Temp: 97.7 F (36.5 C)   TempSrc: Oral   SpO2: 94%   Weight: 60.3 kg  (133 lb)   Height: 1.778 m (5' 10)     Current Outpatient Medications   Medication Sig Dispense Refill    buprenorphine-naloxone (SUBOXONE) 8-2 MG FILM SL film DISSOLVE 2 FILMS UNDER THE TONGUE ONCE DAILY       No current facility-administered medications for this visit.        PAST MEDICAL HISTORY:   Medical: Pt  has no past medical history on file.  Surgical: Pt  has no past surgical history on file.  Family: Pt family history is not on file.  Social: Pt   Social History     Socioeconomic History    Marital status: Single     Spouse name: Not on file    Number of children: Not on file    Years of education: Not on file    Highest education level: Not on file   Occupational History    Not on file   Tobacco Use    Smoking status: Never    Smokeless tobacco: Never   Substance and Sexual Activity    Alcohol use: Not on file    Drug use: Not on file    Sexual activity: Not on file   Other Topics Concern    Not on file   Social History Narrative    Not on file     Social Drivers of Health     Financial Resource Strain: Not on file   Food Insecurity: Not on file   Transportation Needs: Not on file   Physical Activity: Not on file   Stress: Not on file   Social Connections: Not on file   Intimate Partner Violence: Not on file   Housing Stability: Not on file       Allergies: Patient  Allergies   Allergen Reactions    Quetiapine Fumarate Other (See Comments)     Pt said, I can't take Seroquel, it makes me want to kill people.       Patient Care Team:  None, None as PCP - General      DIAGNOSTICS:     No results found for any visits on 10/21/24.     The results of the diagnostic studies have been independently interpreted by myself: All studies will be over-read by Radiologist and patient will be called if any changes to treatment or diagnosis based on final read.  I ADVISED PATIENT TO GO TO ER IF SYMPTOMS WORSEN , CHANGE OR FAILS TO IMPROVE.      I have discussed the diagnosis with the patient and the intended  plan as seen in the above orders.  The patient also understands that early in the process of an illness, the urgent care workup can be falsely reassuring. Routine discharge counseling and specific return precautions dicussed with the patient and the patient understand that worsening, changing or persistent symptoms should prompt an immediate return to an urgent care or emergency department. Patient/Guardian expressed understanding and agrees with the discharge instructions. No further questions at this time of discharge.  The patient has received an after-visit summary and questions were answered concerning future plans.  I have discussed medication side effects and warnings with the patient as well. The patient agrees and understands above plan.         (Please note that parts of this dictation were completed with voice recognition software. Quite often unanticipated grammatical, syntax, homophones, and other interpretive errors are inadvertently transcribed by the computer software. Efforts were made to edit the dictation but occasionally words remain mis-transcribed.)     An electronic signature was used to authenticate this note.  -- DARICE PIETY, APRN - CNP

## 2024-10-21 NOTE — Patient Instructions (Addendum)
-  use steroid cream twice daily for 14 days, stop once redness and itch improves  -do not scratch  -then apply thick layer of Eucerin Advanced Repair cream on area within 3 minutes of bathing  -take zyrtec daily for itching relief  -avoid hot water, scrubs, fragranced products  -limit soaps to armpits and groin  -follow up if you develop any signs of infection  -Follow up with dermatology - referral provided
